# Patient Record
Sex: Male | Born: 1945 | Race: Black or African American | Hispanic: No | Marital: Married | Smoking: Former smoker
Health system: Southern US, Community
[De-identification: ages and names within clinical notes are randomized; demographics above are authoritative.]

## PROBLEM LIST (undated history)

## (undated) DIAGNOSIS — M810 Age-related osteoporosis without current pathological fracture: Secondary | ICD-10-CM

## (undated) DIAGNOSIS — R0902 Hypoxemia: Secondary | ICD-10-CM

## (undated) DIAGNOSIS — K219 Gastro-esophageal reflux disease without esophagitis: Secondary | ICD-10-CM

## (undated) DIAGNOSIS — J449 Chronic obstructive pulmonary disease, unspecified: Secondary | ICD-10-CM

## (undated) DIAGNOSIS — F32A Depression, unspecified: Secondary | ICD-10-CM

## (undated) DIAGNOSIS — F329 Major depressive disorder, single episode, unspecified: Secondary | ICD-10-CM

## (undated) HISTORY — DX: Age-related osteoporosis without current pathological fracture: M81.0

## (undated) HISTORY — DX: Chronic obstructive pulmonary disease, unspecified: J44.9

## (undated) HISTORY — DX: Hypoxemia: R09.02

## (undated) HISTORY — DX: Major depressive disorder, single episode, unspecified: F32.9

## (undated) HISTORY — DX: Gastro-esophageal reflux disease without esophagitis: K21.9

## (undated) HISTORY — DX: Depression, unspecified: F32.A

---

## 2011-08-20 ENCOUNTER — Encounter: Payer: Self-pay | Admitting: Physician Assistant

## 2011-09-07 ENCOUNTER — Encounter: Payer: Self-pay | Admitting: Physician Assistant

## 2011-10-05 ENCOUNTER — Encounter: Payer: Self-pay | Admitting: Physician Assistant

## 2011-11-05 ENCOUNTER — Encounter: Payer: Self-pay | Admitting: Physician Assistant

## 2011-12-05 ENCOUNTER — Encounter: Payer: Self-pay | Admitting: Physician Assistant

## 2014-09-14 DIAGNOSIS — M542 Cervicalgia: Secondary | ICD-10-CM | POA: Diagnosis not present

## 2014-09-14 DIAGNOSIS — J441 Chronic obstructive pulmonary disease with (acute) exacerbation: Secondary | ICD-10-CM | POA: Diagnosis not present

## 2014-09-21 DIAGNOSIS — J449 Chronic obstructive pulmonary disease, unspecified: Secondary | ICD-10-CM | POA: Diagnosis not present

## 2014-09-30 DIAGNOSIS — J449 Chronic obstructive pulmonary disease, unspecified: Secondary | ICD-10-CM | POA: Diagnosis not present

## 2014-10-11 DIAGNOSIS — M810 Age-related osteoporosis without current pathological fracture: Secondary | ICD-10-CM | POA: Diagnosis not present

## 2014-10-11 DIAGNOSIS — R0602 Shortness of breath: Secondary | ICD-10-CM | POA: Diagnosis not present

## 2014-10-11 DIAGNOSIS — Z7952 Long term (current) use of systemic steroids: Secondary | ICD-10-CM | POA: Diagnosis not present

## 2014-10-11 DIAGNOSIS — J441 Chronic obstructive pulmonary disease with (acute) exacerbation: Secondary | ICD-10-CM | POA: Diagnosis not present

## 2014-10-11 DIAGNOSIS — Z87891 Personal history of nicotine dependence: Secondary | ICD-10-CM | POA: Diagnosis not present

## 2014-10-11 DIAGNOSIS — K219 Gastro-esophageal reflux disease without esophagitis: Secondary | ICD-10-CM | POA: Diagnosis not present

## 2014-10-11 DIAGNOSIS — R0789 Other chest pain: Secondary | ICD-10-CM | POA: Diagnosis not present

## 2014-10-11 DIAGNOSIS — A419 Sepsis, unspecified organism: Secondary | ICD-10-CM | POA: Diagnosis not present

## 2014-10-11 DIAGNOSIS — Z79899 Other long term (current) drug therapy: Secondary | ICD-10-CM | POA: Diagnosis not present

## 2014-10-11 DIAGNOSIS — J9602 Acute respiratory failure with hypercapnia: Secondary | ICD-10-CM | POA: Diagnosis not present

## 2014-10-11 DIAGNOSIS — J449 Chronic obstructive pulmonary disease, unspecified: Secondary | ICD-10-CM | POA: Diagnosis not present

## 2014-10-18 DIAGNOSIS — J441 Chronic obstructive pulmonary disease with (acute) exacerbation: Secondary | ICD-10-CM | POA: Diagnosis not present

## 2014-10-18 DIAGNOSIS — Z9981 Dependence on supplemental oxygen: Secondary | ICD-10-CM | POA: Diagnosis not present

## 2014-10-19 DIAGNOSIS — J441 Chronic obstructive pulmonary disease with (acute) exacerbation: Secondary | ICD-10-CM | POA: Diagnosis not present

## 2014-10-19 DIAGNOSIS — Z9981 Dependence on supplemental oxygen: Secondary | ICD-10-CM | POA: Diagnosis not present

## 2014-10-20 DIAGNOSIS — J9692 Respiratory failure, unspecified with hypercapnia: Secondary | ICD-10-CM | POA: Diagnosis not present

## 2014-10-20 DIAGNOSIS — J441 Chronic obstructive pulmonary disease with (acute) exacerbation: Secondary | ICD-10-CM | POA: Diagnosis not present

## 2014-10-20 DIAGNOSIS — A419 Sepsis, unspecified organism: Secondary | ICD-10-CM | POA: Diagnosis not present

## 2014-10-21 DIAGNOSIS — J441 Chronic obstructive pulmonary disease with (acute) exacerbation: Secondary | ICD-10-CM | POA: Diagnosis not present

## 2014-10-21 DIAGNOSIS — Z9981 Dependence on supplemental oxygen: Secondary | ICD-10-CM | POA: Diagnosis not present

## 2014-10-25 DIAGNOSIS — J441 Chronic obstructive pulmonary disease with (acute) exacerbation: Secondary | ICD-10-CM | POA: Diagnosis not present

## 2014-10-25 DIAGNOSIS — Z9981 Dependence on supplemental oxygen: Secondary | ICD-10-CM | POA: Diagnosis not present

## 2014-10-26 DIAGNOSIS — J441 Chronic obstructive pulmonary disease with (acute) exacerbation: Secondary | ICD-10-CM | POA: Diagnosis not present

## 2014-10-26 DIAGNOSIS — Z9981 Dependence on supplemental oxygen: Secondary | ICD-10-CM | POA: Diagnosis not present

## 2014-10-28 DIAGNOSIS — Z9981 Dependence on supplemental oxygen: Secondary | ICD-10-CM | POA: Diagnosis not present

## 2014-10-28 DIAGNOSIS — J441 Chronic obstructive pulmonary disease with (acute) exacerbation: Secondary | ICD-10-CM | POA: Diagnosis not present

## 2014-10-29 DIAGNOSIS — J449 Chronic obstructive pulmonary disease, unspecified: Secondary | ICD-10-CM | POA: Diagnosis not present

## 2014-10-29 DIAGNOSIS — J441 Chronic obstructive pulmonary disease with (acute) exacerbation: Secondary | ICD-10-CM | POA: Diagnosis not present

## 2014-10-31 DIAGNOSIS — R069 Unspecified abnormalities of breathing: Secondary | ICD-10-CM | POA: Diagnosis not present

## 2014-10-31 DIAGNOSIS — J449 Chronic obstructive pulmonary disease, unspecified: Secondary | ICD-10-CM | POA: Diagnosis not present

## 2014-10-31 DIAGNOSIS — M1990 Unspecified osteoarthritis, unspecified site: Secondary | ICD-10-CM | POA: Diagnosis not present

## 2014-10-31 DIAGNOSIS — R0602 Shortness of breath: Secondary | ICD-10-CM | POA: Diagnosis not present

## 2014-10-31 DIAGNOSIS — R05 Cough: Secondary | ICD-10-CM | POA: Diagnosis not present

## 2014-10-31 DIAGNOSIS — Z87891 Personal history of nicotine dependence: Secondary | ICD-10-CM | POA: Diagnosis not present

## 2014-11-01 DIAGNOSIS — Z9981 Dependence on supplemental oxygen: Secondary | ICD-10-CM | POA: Diagnosis not present

## 2014-11-01 DIAGNOSIS — J441 Chronic obstructive pulmonary disease with (acute) exacerbation: Secondary | ICD-10-CM | POA: Diagnosis not present

## 2014-11-03 DIAGNOSIS — J441 Chronic obstructive pulmonary disease with (acute) exacerbation: Secondary | ICD-10-CM | POA: Diagnosis not present

## 2014-11-03 DIAGNOSIS — Z9981 Dependence on supplemental oxygen: Secondary | ICD-10-CM | POA: Diagnosis not present

## 2014-11-08 DIAGNOSIS — Z9981 Dependence on supplemental oxygen: Secondary | ICD-10-CM | POA: Diagnosis not present

## 2014-11-08 DIAGNOSIS — J441 Chronic obstructive pulmonary disease with (acute) exacerbation: Secondary | ICD-10-CM | POA: Diagnosis not present

## 2014-11-10 DIAGNOSIS — J441 Chronic obstructive pulmonary disease with (acute) exacerbation: Secondary | ICD-10-CM | POA: Diagnosis not present

## 2014-11-10 DIAGNOSIS — Z9981 Dependence on supplemental oxygen: Secondary | ICD-10-CM | POA: Diagnosis not present

## 2014-11-15 DIAGNOSIS — J441 Chronic obstructive pulmonary disease with (acute) exacerbation: Secondary | ICD-10-CM | POA: Diagnosis not present

## 2014-11-15 DIAGNOSIS — Z9981 Dependence on supplemental oxygen: Secondary | ICD-10-CM | POA: Diagnosis not present

## 2014-11-17 DIAGNOSIS — J441 Chronic obstructive pulmonary disease with (acute) exacerbation: Secondary | ICD-10-CM | POA: Diagnosis not present

## 2014-11-17 DIAGNOSIS — Z9981 Dependence on supplemental oxygen: Secondary | ICD-10-CM | POA: Diagnosis not present

## 2014-11-22 DIAGNOSIS — J441 Chronic obstructive pulmonary disease with (acute) exacerbation: Secondary | ICD-10-CM | POA: Diagnosis not present

## 2014-11-22 DIAGNOSIS — Z9981 Dependence on supplemental oxygen: Secondary | ICD-10-CM | POA: Diagnosis not present

## 2014-11-24 DIAGNOSIS — Z9981 Dependence on supplemental oxygen: Secondary | ICD-10-CM | POA: Diagnosis not present

## 2014-11-24 DIAGNOSIS — J441 Chronic obstructive pulmonary disease with (acute) exacerbation: Secondary | ICD-10-CM | POA: Diagnosis not present

## 2014-11-29 DIAGNOSIS — Z9981 Dependence on supplemental oxygen: Secondary | ICD-10-CM | POA: Diagnosis not present

## 2014-11-29 DIAGNOSIS — J441 Chronic obstructive pulmonary disease with (acute) exacerbation: Secondary | ICD-10-CM | POA: Diagnosis not present

## 2014-12-01 DIAGNOSIS — J441 Chronic obstructive pulmonary disease with (acute) exacerbation: Secondary | ICD-10-CM | POA: Diagnosis not present

## 2014-12-01 DIAGNOSIS — Z9981 Dependence on supplemental oxygen: Secondary | ICD-10-CM | POA: Diagnosis not present

## 2015-01-04 DIAGNOSIS — J9602 Acute respiratory failure with hypercapnia: Secondary | ICD-10-CM | POA: Diagnosis not present

## 2015-01-04 DIAGNOSIS — R0602 Shortness of breath: Secondary | ICD-10-CM | POA: Diagnosis not present

## 2015-01-04 DIAGNOSIS — J9622 Acute and chronic respiratory failure with hypercapnia: Secondary | ICD-10-CM | POA: Diagnosis not present

## 2015-01-04 DIAGNOSIS — R531 Weakness: Secondary | ICD-10-CM | POA: Diagnosis not present

## 2015-01-04 DIAGNOSIS — J449 Chronic obstructive pulmonary disease, unspecified: Secondary | ICD-10-CM | POA: Diagnosis not present

## 2015-01-04 DIAGNOSIS — D62 Acute posthemorrhagic anemia: Secondary | ICD-10-CM | POA: Diagnosis not present

## 2015-01-04 DIAGNOSIS — K2970 Gastritis, unspecified, without bleeding: Secondary | ICD-10-CM | POA: Diagnosis not present

## 2015-01-04 DIAGNOSIS — J9690 Respiratory failure, unspecified, unspecified whether with hypoxia or hypercapnia: Secondary | ICD-10-CM | POA: Diagnosis not present

## 2015-01-04 DIAGNOSIS — J189 Pneumonia, unspecified organism: Secondary | ICD-10-CM | POA: Diagnosis not present

## 2015-01-04 DIAGNOSIS — J9692 Respiratory failure, unspecified with hypercapnia: Secondary | ICD-10-CM | POA: Diagnosis not present

## 2015-01-04 DIAGNOSIS — K922 Gastrointestinal hemorrhage, unspecified: Secondary | ICD-10-CM | POA: Diagnosis not present

## 2015-01-04 DIAGNOSIS — Z4682 Encounter for fitting and adjustment of non-vascular catheter: Secondary | ICD-10-CM | POA: Diagnosis not present

## 2015-01-04 DIAGNOSIS — J9600 Acute respiratory failure, unspecified whether with hypoxia or hypercapnia: Secondary | ICD-10-CM | POA: Diagnosis not present

## 2015-01-04 DIAGNOSIS — J156 Pneumonia due to other aerobic Gram-negative bacteria: Secondary | ICD-10-CM | POA: Diagnosis not present

## 2015-01-04 DIAGNOSIS — R0600 Dyspnea, unspecified: Secondary | ICD-10-CM | POA: Diagnosis not present

## 2015-01-04 DIAGNOSIS — R404 Transient alteration of awareness: Secondary | ICD-10-CM | POA: Diagnosis not present

## 2015-01-04 DIAGNOSIS — I952 Hypotension due to drugs: Secondary | ICD-10-CM | POA: Diagnosis not present

## 2015-01-04 DIAGNOSIS — N400 Enlarged prostate without lower urinary tract symptoms: Secondary | ICD-10-CM | POA: Diagnosis not present

## 2015-01-04 DIAGNOSIS — T41295A Adverse effect of other general anesthetics, initial encounter: Secondary | ICD-10-CM | POA: Diagnosis not present

## 2015-01-04 DIAGNOSIS — A419 Sepsis, unspecified organism: Secondary | ICD-10-CM | POA: Diagnosis not present

## 2015-01-04 DIAGNOSIS — J9811 Atelectasis: Secondary | ICD-10-CM | POA: Diagnosis not present

## 2015-01-04 DIAGNOSIS — D649 Anemia, unspecified: Secondary | ICD-10-CM | POA: Diagnosis not present

## 2015-01-04 DIAGNOSIS — Z7401 Bed confinement status: Secondary | ICD-10-CM | POA: Diagnosis not present

## 2015-01-04 DIAGNOSIS — J181 Lobar pneumonia, unspecified organism: Secondary | ICD-10-CM | POA: Diagnosis not present

## 2015-01-04 DIAGNOSIS — Z9981 Dependence on supplemental oxygen: Secondary | ICD-10-CM | POA: Diagnosis not present

## 2015-01-04 DIAGNOSIS — J441 Chronic obstructive pulmonary disease with (acute) exacerbation: Secondary | ICD-10-CM | POA: Diagnosis not present

## 2015-01-04 DIAGNOSIS — J439 Emphysema, unspecified: Secondary | ICD-10-CM | POA: Diagnosis not present

## 2015-01-04 DIAGNOSIS — M1990 Unspecified osteoarthritis, unspecified site: Secondary | ICD-10-CM | POA: Diagnosis not present

## 2015-01-04 DIAGNOSIS — R195 Other fecal abnormalities: Secondary | ICD-10-CM | POA: Diagnosis not present

## 2015-01-04 DIAGNOSIS — K91840 Postprocedural hemorrhage and hematoma of a digestive system organ or structure following a digestive system procedure: Secondary | ICD-10-CM | POA: Diagnosis not present

## 2015-01-14 DIAGNOSIS — R627 Adult failure to thrive: Secondary | ICD-10-CM | POA: Diagnosis not present

## 2015-01-14 DIAGNOSIS — J9602 Acute respiratory failure with hypercapnia: Secondary | ICD-10-CM | POA: Diagnosis not present

## 2015-01-14 DIAGNOSIS — J9692 Respiratory failure, unspecified with hypercapnia: Secondary | ICD-10-CM | POA: Diagnosis not present

## 2015-01-14 DIAGNOSIS — J9610 Chronic respiratory failure, unspecified whether with hypoxia or hypercapnia: Secondary | ICD-10-CM | POA: Diagnosis not present

## 2015-01-14 DIAGNOSIS — J159 Unspecified bacterial pneumonia: Secondary | ICD-10-CM | POA: Diagnosis not present

## 2015-01-14 DIAGNOSIS — J156 Pneumonia due to other aerobic Gram-negative bacteria: Secondary | ICD-10-CM | POA: Diagnosis not present

## 2015-01-14 DIAGNOSIS — A419 Sepsis, unspecified organism: Secondary | ICD-10-CM | POA: Diagnosis not present

## 2015-01-14 DIAGNOSIS — J441 Chronic obstructive pulmonary disease with (acute) exacerbation: Secondary | ICD-10-CM | POA: Diagnosis not present

## 2015-01-14 DIAGNOSIS — Z9981 Dependence on supplemental oxygen: Secondary | ICD-10-CM | POA: Diagnosis not present

## 2015-01-14 DIAGNOSIS — Z7401 Bed confinement status: Secondary | ICD-10-CM | POA: Diagnosis not present

## 2015-01-14 DIAGNOSIS — J9600 Acute respiratory failure, unspecified whether with hypoxia or hypercapnia: Secondary | ICD-10-CM | POA: Diagnosis not present

## 2015-01-18 DIAGNOSIS — R627 Adult failure to thrive: Secondary | ICD-10-CM | POA: Diagnosis not present

## 2015-01-18 DIAGNOSIS — J159 Unspecified bacterial pneumonia: Secondary | ICD-10-CM | POA: Diagnosis not present

## 2015-01-18 DIAGNOSIS — J9610 Chronic respiratory failure, unspecified whether with hypoxia or hypercapnia: Secondary | ICD-10-CM | POA: Diagnosis not present

## 2015-01-18 DIAGNOSIS — J441 Chronic obstructive pulmonary disease with (acute) exacerbation: Secondary | ICD-10-CM | POA: Diagnosis not present

## 2015-01-30 DIAGNOSIS — J9602 Acute respiratory failure with hypercapnia: Secondary | ICD-10-CM | POA: Diagnosis not present

## 2015-01-30 DIAGNOSIS — Z9981 Dependence on supplemental oxygen: Secondary | ICD-10-CM | POA: Diagnosis not present

## 2015-01-30 DIAGNOSIS — J449 Chronic obstructive pulmonary disease, unspecified: Secondary | ICD-10-CM | POA: Diagnosis not present

## 2015-01-30 DIAGNOSIS — K219 Gastro-esophageal reflux disease without esophagitis: Secondary | ICD-10-CM | POA: Diagnosis not present

## 2015-01-30 DIAGNOSIS — D62 Acute posthemorrhagic anemia: Secondary | ICD-10-CM | POA: Diagnosis not present

## 2015-01-31 DIAGNOSIS — Z139 Encounter for screening, unspecified: Secondary | ICD-10-CM | POA: Diagnosis not present

## 2015-01-31 DIAGNOSIS — R5381 Other malaise: Secondary | ICD-10-CM | POA: Diagnosis not present

## 2015-01-31 DIAGNOSIS — Z681 Body mass index (BMI) 19 or less, adult: Secondary | ICD-10-CM | POA: Diagnosis not present

## 2015-01-31 DIAGNOSIS — Z9181 History of falling: Secondary | ICD-10-CM | POA: Diagnosis not present

## 2015-01-31 DIAGNOSIS — J449 Chronic obstructive pulmonary disease, unspecified: Secondary | ICD-10-CM | POA: Diagnosis not present

## 2015-01-31 DIAGNOSIS — J9692 Respiratory failure, unspecified with hypercapnia: Secondary | ICD-10-CM | POA: Diagnosis not present

## 2015-01-31 DIAGNOSIS — D500 Iron deficiency anemia secondary to blood loss (chronic): Secondary | ICD-10-CM | POA: Diagnosis not present

## 2015-02-01 ENCOUNTER — Encounter: Payer: Self-pay | Admitting: Internal Medicine

## 2015-02-01 ENCOUNTER — Encounter (INDEPENDENT_AMBULATORY_CARE_PROVIDER_SITE_OTHER): Payer: Self-pay

## 2015-02-01 ENCOUNTER — Ambulatory Visit (INDEPENDENT_AMBULATORY_CARE_PROVIDER_SITE_OTHER)
Admission: RE | Admit: 2015-02-01 | Discharge: 2015-02-01 | Disposition: A | Discharge status: Home / Self Care | Payer: Commercial Managed Care - HMO | Source: Ambulatory Visit | Attending: Internal Medicine | Admitting: Internal Medicine

## 2015-02-01 ENCOUNTER — Ambulatory Visit (INDEPENDENT_AMBULATORY_CARE_PROVIDER_SITE_OTHER): Payer: Commercial Managed Care - HMO | Admitting: Internal Medicine

## 2015-02-01 ENCOUNTER — Other Ambulatory Visit (INDEPENDENT_AMBULATORY_CARE_PROVIDER_SITE_OTHER): Payer: Commercial Managed Care - HMO

## 2015-02-01 VITALS — BP 110/78 | HR 114 | Ht 66.0 in | Wt 96.4 lb

## 2015-02-01 DIAGNOSIS — J9612 Chronic respiratory failure with hypercapnia: Secondary | ICD-10-CM | POA: Diagnosis not present

## 2015-02-01 DIAGNOSIS — R946 Abnormal results of thyroid function studies: Secondary | ICD-10-CM

## 2015-02-01 DIAGNOSIS — R0600 Dyspnea, unspecified: Secondary | ICD-10-CM

## 2015-02-01 DIAGNOSIS — J449 Chronic obstructive pulmonary disease, unspecified: Secondary | ICD-10-CM | POA: Diagnosis not present

## 2015-02-01 LAB — BASIC METABOLIC PANEL
BUN: 15 mg/dL (ref 6–23)
CO2: 45 mEq/L — ABNORMAL HIGH (ref 19–32)
Calcium: 10.1 mg/dL (ref 8.4–10.5)
Chloride: 93 mEq/L — ABNORMAL LOW (ref 96–112)
Creatinine, Ser: 0.72 mg/dL (ref 0.40–1.50)
GFR: 139.89 mL/min (ref >60.00)
Glucose, Bld: 166 mg/dL — ABNORMAL HIGH (ref 70–99)
Potassium: 4.4 mEq/L (ref 3.5–5.1)
SODIUM: 146 meq/L — AB (ref 135–145)

## 2015-02-01 LAB — CBC WITH DIFFERENTIAL/PLATELET
BASOS PCT: 0 % (ref 0.0–3.0)
Basophils Absolute: 0 10*3/uL (ref 0.0–0.1)
EOS ABS: 0 10*3/uL (ref 0.0–0.7)
EOS PCT: 0.1 % (ref 0.0–5.0)
HCT: 36.5 % — ABNORMAL LOW (ref 39.0–52.0)
HEMOGLOBIN: 11.6 g/dL — AB (ref 13.0–17.0)
Lymphocytes Relative: 2.7 % — ABNORMAL LOW (ref 12.0–46.0)
Lymphs Abs: 0.3 10*3/uL — ABNORMAL LOW (ref 0.7–4.0)
MCHC: 31.6 g/dL (ref 30.0–36.0)
MCV: 93.4 fl (ref 78.0–100.0)
MONO ABS: 0.7 10*3/uL (ref 0.1–1.0)
Monocytes Relative: 6.2 % (ref 3.0–12.0)
NEUTROS ABS: 10.6 10*3/uL — AB (ref 1.4–7.7)
Platelets: 337 10*3/uL (ref 150.0–400.0)
RBC: 3.91 Mil/uL — AB (ref 4.22–5.81)
RDW: 15.2 % (ref 11.5–15.5)
WBC: 11.7 10*3/uL — AB (ref 4.0–10.5)

## 2015-02-01 LAB — TSH: TSH: 0.2 u[IU]/mL — ABNORMAL LOW (ref 0.35–4.50)

## 2015-02-01 LAB — BRAIN NATRIURETIC PEPTIDE: Pro B Natriuretic peptide (BNP): 16 pg/mL (ref 0.0–100.0)

## 2015-02-01 MED ORDER — UMECLIDINIUM-VILANTEROL 62.5-25 MCG/INH IN AEPB: 2.0000 | INHALATION_SPRAY | Freq: Once | RESPIRATORY_TRACT | Status: DC

## 2015-02-01 NOTE — Progress Notes (Signed)
Subjective:    Patient ID: Edgar Robinson, male    DOB: 03/04/1946,    MRN: [id number]  HPI  71 yobm ?quit smoking 2008 (records say still smoking at [facility] 01/04/15) 02 since 2009 with dx of severe copd with variable doe best days = on 2lpm across the room just discharged from [facility] 01/04/15 to 01/14/15 with CAP/ hypercarbic rf requiring vent but not trach and 18 days snf p d/c from [place] referred 02/01/15 by Cyndi Bender to pulmonary  clinic    02/01/2015 1st [facility] office visit/ Avalyn Molino   Chief Complaint  Patient presents with  . Pulmonary Consult    Referred by Cyndi Bender, PA. Pt states dxed with COPD in 2009. He c/o increased SOB for the past several yrs. Has had PNA x 2 in 2016 and was intubated in May 2016. He c/o SOB with any exertion.    doe x 50 ft on 02 but also very weak assoc with dysphagia/ heartburn x months / maint on symbicort but can't use it adequately/ neb alb helps some   No obvious day to day or daytime variabilty or assoc chronic cough or cp or chest tightness, subjective wheeze  Or overt sinus  symptoms. No unusual exp hx or h/o childhood pna/ asthma or knowledge of premature birth.  Sleeping ok on 02  without nocturnal  or early am exacerbation  of respiratory  c/o's or need for noct saba. Also denies any obvious fluctuation of symptoms with weather or environmental changes or other aggravating or alleviating factors except as outlined above   Current Medications, Allergies, Complete Past Medical History, Past Surgical History, Family History, and Social History were reviewed in [employer] record.        Review of Systems  Constitutional: Positive for appetite change and unexpected weight change. Negative for fever, chills and activity change.  HENT: Positive for congestion, sore throat and trouble swallowing. Negative for dental problem, postnasal drip, rhinorrhea, sneezing and voice change.   Eyes: Negative for  visual disturbance.  Respiratory: Positive for cough and shortness of breath. Negative for choking.   Cardiovascular: Negative for chest pain and leg swelling.  Gastrointestinal: Negative for nausea, vomiting and abdominal pain.  Genitourinary: Negative for difficulty urinating.       Acid heartburn Indigestion  Musculoskeletal: Negative for arthralgias.  Skin: Negative for rash.  Psychiatric/Behavioral: Negative for behavioral problems and confusion.       Objective:   Physical Exam  Very thin bm hopeless affect  Wt Readings from Last 3 Encounters:  02/01/15 96 lb 6.4 oz (43.727 kg)    Vital signs reviewed   HEENT mild turbinate edema.  Oropharynx no thrush or excess pnd or cobblestoning.  No JVD or cervical adenopathy. Mild accessory muscle hypertrophy. Trachea midline, nl thryroid. Chest was hyperinflated by percussion with diminished breath sounds and moderate increased exp time without wheeze. Hoover sign positive at mid inspiration. Regular rate and rhythm without murmur gallop or rub or increase P2 or edema.  Abd: no hsm, nl excursion. Ext warm without cyanosis or clubbing. Musculature ix very wasted.    CXR PA and Lateral:   02/01/2015 :     I personally reviewed images and agree with radiology impression as follows:     COPD with bullous changes on the left and coarse increased interstitial markings on the right. There is no evidence of acute cardiopulmonary abnormality.    Labs ordered/ reviewed:   Lab 02/01/15 1207  NA 146*  K 4.4  CL 93*  CO2 45*  BUN 15  CREATININE 0.72  GLUCOSE 166*     Lab 02/01/15 1207  HGB 11.6*  HCT 36.5*  WBC 11.7*  PLT 337.0     Lab Results  Component Value Date   TSH 0.20* 02/01/2015     Lab Results  Component Value Date   PROBNP 16.0 02/01/2015     No results found for: ESRSEDRATE, SEDRATE, POCTSEDRATE         Assessment & Plan:

## 2015-02-01 NOTE — Patient Instructions (Addendum)
Stop symbicort/ fosfamax  Start Anoro 2 good drags first thing in am  Only use your albuterol as a rescue medication to be used if you can't catch your breath by resting or doing a relaxed purse lip breathing pattern.  - The less you use it, the better it will work when you need it. - Ok to use up to 4 hours if you must but call for immediate appointment if use goes up over your usual need  Continue protonix  Take 30-60 min before first meal of the day   GERD (REFLUX)  is an extremely common cause of respiratory symptoms just like yours , many times with no obvious heartburn at all.    It can be treated with medication, but also with lifestyle changes including elevation of the head of your bed (ideally with 6 inch  bed blocks),  Smoking cessation, avoidance of late meals, excessive alcohol, and avoid fatty foods, chocolate, peppermint, colas, red wine, and acidic juices such as orange juice.  NO MINT OR MENTHOL PRODUCTS SO NO COUGH DROPS  USE SUGARLESS CANDY INSTEAD (Jolley ranchers or Stover's or Life Savers) or even ice chips will also do - the key is to swallow to prevent all throat clearing. NO OIL BASED VITAMINS - use powdered substitutes.  Please remember to go to the lab and x-[facility] department downstairs for your tests - we will call you with the results when they are available.    Please schedule a follow up office visit in 2 weeks, sooner if needed with all active medications in hand including nebulizer meds  and over the counters

## 2015-02-02 ENCOUNTER — Encounter: Payer: Self-pay | Admitting: Internal Medicine

## 2015-02-02 DIAGNOSIS — J9612 Chronic respiratory failure with hypercapnia: Secondary | ICD-10-CM | POA: Insufficient documentation

## 2015-02-02 DIAGNOSIS — R946 Abnormal results of thyroid function studies: Secondary | ICD-10-CM | POA: Insufficient documentation

## 2015-02-02 NOTE — Assessment & Plan Note (Addendum)
DDX of  difficult airways management all start with A and  include Adherence, Ace Inhibitors, Acid Reflux, Active Sinus Disease, Alpha 1 Antitripsin deficiency, Anxiety masquerading as Airways dz,  ABPA,  allergy(esp in young), Aspiration (esp in elderly), Adverse effects of meds,  Active smokers, A bunch of PE's (a small clot burden can't cause this syndrome unless there is already severe underlying pulm or vascular dz with poor reserve) plus two Bs  = Bronchiectasis and Beta blocker use..and one C= CHF  Adherence is always the initial "prime suspect" and is a multilayered concern that requires a "trust but verify" approach in every patient - starting with knowing how to use medications, especially inhalers, correctly, keeping up with refills and understanding the fundamental difference between maintenance and prns vs those medications only taken for a very short course and then stopped and not refilled.  - The proper method of use, as well as anticipated side effects, of a metered-dose inhaler are discussed and demonstrated to the patient. Improved effectiveness after extensive coaching during this visit to a level of approximately  25% so rec d/c symbicort / 90% with dpi so rec trial of anoro  ? Active smoker > he denies since 2008 but records suggest still smoking one month prior to OV    ? Acid (or non-acid) GERD > always difficult to exclude as up to 75% of pts in some series report no assoc GI/ Heartburn symptoms> rec max (24h)  acid suppression and diet restrictions/ reviewed and instructions given in writing and suggested he stop fosfamax for now for same reason   I had an extended discussion with the patient reviewing all relevant studies completed to date and  lasting  4 m Each maintenance medication was reviewed in detail including most importantly the difference between maintenance and prns and under what circumstances the prns are to be triggered using an action plan format that is not  reflected in the computer generated alphabetically organized AVS.    Please see instructions for details which were reviewed in writing and the patient given a copy highlighting the part that I personally wrote and discussed at today's ov.

## 2015-02-02 NOTE — Assessment & Plan Note (Signed)
Clearly multifactorial but at present he appears more limited by muscle wasting than copd ? Underlying ca or thyroid dz

## 2015-02-02 NOTE — Assessment & Plan Note (Signed)
-    02/02/2015   Walked RA x one lap 50 f stopped due to  Sob weak/ not sob - HCO03 45 02/01/15   Adequate control on present rx, reviewed > no change in rx needed    NB Though somewhat paradoxic, when the lung fails to clear C02 properly and pC02 rises the lung then becomes a more efficient scavenger of C02 allowing lower work of breathing and  better C02 clearance albeit at a higher serum pC02 level - this is why pts can look a lot better than their ABG's would suggest and why it's so difficult to prognosticate endstage dz.  It's also why I strongly rec DNI status (ventilating pts down to a nl pC02 adversely affects this compensatory mechanism)

## 2015-02-02 NOTE — Assessment & Plan Note (Signed)
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

## 2015-02-02 NOTE — Progress Notes (Signed)
Quick Note:  Spoke with pt and notified of results per Dr. Wert. Pt verbalized understanding and denied any questions.  ______ 

## 2015-02-02 NOTE — Progress Notes (Signed)
Quick Note:  Called spoke with pt. Reviewed results and recs. Patient has a appt on 02/25/15 with TP. Patient voiced understanding and had no further questions. ______

## 2015-02-03 DIAGNOSIS — J9602 Acute respiratory failure with hypercapnia: Secondary | ICD-10-CM | POA: Diagnosis not present

## 2015-02-03 DIAGNOSIS — J449 Chronic obstructive pulmonary disease, unspecified: Secondary | ICD-10-CM | POA: Diagnosis not present

## 2015-02-03 DIAGNOSIS — Z9981 Dependence on supplemental oxygen: Secondary | ICD-10-CM | POA: Diagnosis not present

## 2015-02-03 DIAGNOSIS — K219 Gastro-esophageal reflux disease without esophagitis: Secondary | ICD-10-CM | POA: Diagnosis not present

## 2015-02-03 DIAGNOSIS — D62 Acute posthemorrhagic anemia: Secondary | ICD-10-CM | POA: Diagnosis not present

## 2015-02-04 DIAGNOSIS — D62 Acute posthemorrhagic anemia: Secondary | ICD-10-CM | POA: Diagnosis not present

## 2015-02-04 DIAGNOSIS — J449 Chronic obstructive pulmonary disease, unspecified: Secondary | ICD-10-CM | POA: Diagnosis not present

## 2015-02-04 DIAGNOSIS — Z9981 Dependence on supplemental oxygen: Secondary | ICD-10-CM | POA: Diagnosis not present

## 2015-02-04 DIAGNOSIS — J9602 Acute respiratory failure with hypercapnia: Secondary | ICD-10-CM | POA: Diagnosis not present

## 2015-02-04 DIAGNOSIS — K219 Gastro-esophageal reflux disease without esophagitis: Secondary | ICD-10-CM | POA: Diagnosis not present

## 2015-02-05 DIAGNOSIS — J449 Chronic obstructive pulmonary disease, unspecified: Secondary | ICD-10-CM | POA: Diagnosis not present

## 2015-02-05 DIAGNOSIS — K219 Gastro-esophageal reflux disease without esophagitis: Secondary | ICD-10-CM | POA: Diagnosis not present

## 2015-02-05 DIAGNOSIS — D62 Acute posthemorrhagic anemia: Secondary | ICD-10-CM | POA: Diagnosis not present

## 2015-02-05 DIAGNOSIS — J9602 Acute respiratory failure with hypercapnia: Secondary | ICD-10-CM | POA: Diagnosis not present

## 2015-02-05 DIAGNOSIS — Z9981 Dependence on supplemental oxygen: Secondary | ICD-10-CM | POA: Diagnosis not present

## 2015-02-09 DIAGNOSIS — J9602 Acute respiratory failure with hypercapnia: Secondary | ICD-10-CM | POA: Diagnosis not present

## 2015-02-09 DIAGNOSIS — J449 Chronic obstructive pulmonary disease, unspecified: Secondary | ICD-10-CM | POA: Diagnosis not present

## 2015-02-09 DIAGNOSIS — Z9981 Dependence on supplemental oxygen: Secondary | ICD-10-CM | POA: Diagnosis not present

## 2015-02-09 DIAGNOSIS — D62 Acute posthemorrhagic anemia: Secondary | ICD-10-CM | POA: Diagnosis not present

## 2015-02-09 DIAGNOSIS — K219 Gastro-esophageal reflux disease without esophagitis: Secondary | ICD-10-CM | POA: Diagnosis not present

## 2015-02-15 DIAGNOSIS — J449 Chronic obstructive pulmonary disease, unspecified: Secondary | ICD-10-CM | POA: Diagnosis not present

## 2015-02-15 DIAGNOSIS — Z9981 Dependence on supplemental oxygen: Secondary | ICD-10-CM | POA: Diagnosis not present

## 2015-02-15 DIAGNOSIS — J9602 Acute respiratory failure with hypercapnia: Secondary | ICD-10-CM | POA: Diagnosis not present

## 2015-02-15 DIAGNOSIS — K219 Gastro-esophageal reflux disease without esophagitis: Secondary | ICD-10-CM | POA: Diagnosis not present

## 2015-02-15 DIAGNOSIS — D62 Acute posthemorrhagic anemia: Secondary | ICD-10-CM | POA: Diagnosis not present

## 2015-02-25 ENCOUNTER — Encounter: Payer: Commercial Managed Care - HMO | Admitting: Adult Health

## 2015-02-25 DIAGNOSIS — Z9981 Dependence on supplemental oxygen: Secondary | ICD-10-CM | POA: Diagnosis not present

## 2015-02-25 DIAGNOSIS — D62 Acute posthemorrhagic anemia: Secondary | ICD-10-CM | POA: Diagnosis not present

## 2015-02-25 DIAGNOSIS — J449 Chronic obstructive pulmonary disease, unspecified: Secondary | ICD-10-CM | POA: Diagnosis not present

## 2015-02-25 DIAGNOSIS — J9602 Acute respiratory failure with hypercapnia: Secondary | ICD-10-CM | POA: Diagnosis not present

## 2015-02-25 DIAGNOSIS — K219 Gastro-esophageal reflux disease without esophagitis: Secondary | ICD-10-CM | POA: Diagnosis not present

## 2015-03-03 DIAGNOSIS — L89301 Pressure ulcer of unspecified buttock, stage 1: Secondary | ICD-10-CM | POA: Diagnosis not present

## 2015-03-03 DIAGNOSIS — K219 Gastro-esophageal reflux disease without esophagitis: Secondary | ICD-10-CM | POA: Diagnosis not present

## 2015-03-03 DIAGNOSIS — J449 Chronic obstructive pulmonary disease, unspecified: Secondary | ICD-10-CM | POA: Diagnosis not present

## 2015-03-03 DIAGNOSIS — Z681 Body mass index (BMI) 19 or less, adult: Secondary | ICD-10-CM | POA: Diagnosis not present

## 2015-03-03 DIAGNOSIS — M810 Age-related osteoporosis without current pathological fracture: Secondary | ICD-10-CM | POA: Diagnosis not present

## 2015-04-28 DIAGNOSIS — R6881 Early satiety: Secondary | ICD-10-CM | POA: Diagnosis not present

## 2015-04-28 DIAGNOSIS — Z23 Encounter for immunization: Secondary | ICD-10-CM | POA: Diagnosis not present

## 2015-04-28 DIAGNOSIS — J449 Chronic obstructive pulmonary disease, unspecified: Secondary | ICD-10-CM | POA: Diagnosis not present

## 2015-04-28 DIAGNOSIS — K219 Gastro-esophageal reflux disease without esophagitis: Secondary | ICD-10-CM | POA: Diagnosis not present

## 2015-04-28 DIAGNOSIS — R140 Abdominal distension (gaseous): Secondary | ICD-10-CM | POA: Diagnosis not present

## 2015-04-29 DIAGNOSIS — R140 Abdominal distension (gaseous): Secondary | ICD-10-CM | POA: Diagnosis not present

## 2015-06-08 DIAGNOSIS — Z1389 Encounter for screening for other disorder: Secondary | ICD-10-CM | POA: Diagnosis not present

## 2015-06-08 DIAGNOSIS — M810 Age-related osteoporosis without current pathological fracture: Secondary | ICD-10-CM | POA: Diagnosis not present

## 2015-06-08 DIAGNOSIS — Z23 Encounter for immunization: Secondary | ICD-10-CM | POA: Diagnosis not present

## 2015-06-08 DIAGNOSIS — K219 Gastro-esophageal reflux disease without esophagitis: Secondary | ICD-10-CM | POA: Diagnosis not present

## 2015-06-08 DIAGNOSIS — J449 Chronic obstructive pulmonary disease, unspecified: Secondary | ICD-10-CM | POA: Diagnosis not present

## 2015-07-04 DIAGNOSIS — R0602 Shortness of breath: Secondary | ICD-10-CM | POA: Diagnosis not present

## 2015-09-07 DIAGNOSIS — Z1389 Encounter for screening for other disorder: Secondary | ICD-10-CM | POA: Diagnosis not present

## 2015-09-07 DIAGNOSIS — Z79899 Other long term (current) drug therapy: Secondary | ICD-10-CM | POA: Diagnosis not present

## 2015-09-07 DIAGNOSIS — K219 Gastro-esophageal reflux disease without esophagitis: Secondary | ICD-10-CM | POA: Diagnosis not present

## 2015-09-07 DIAGNOSIS — Z681 Body mass index (BMI) 19 or less, adult: Secondary | ICD-10-CM | POA: Diagnosis not present

## 2015-09-07 DIAGNOSIS — E559 Vitamin D deficiency, unspecified: Secondary | ICD-10-CM | POA: Diagnosis not present

## 2015-09-07 DIAGNOSIS — M810 Age-related osteoporosis without current pathological fracture: Secondary | ICD-10-CM | POA: Diagnosis not present

## 2015-09-07 DIAGNOSIS — Z125 Encounter for screening for malignant neoplasm of prostate: Secondary | ICD-10-CM | POA: Diagnosis not present

## 2015-09-07 DIAGNOSIS — J449 Chronic obstructive pulmonary disease, unspecified: Secondary | ICD-10-CM | POA: Diagnosis not present

## 2015-09-27 DIAGNOSIS — J449 Chronic obstructive pulmonary disease, unspecified: Secondary | ICD-10-CM | POA: Diagnosis not present

## 2015-12-05 DIAGNOSIS — Z1389 Encounter for screening for other disorder: Secondary | ICD-10-CM | POA: Diagnosis not present

## 2015-12-05 DIAGNOSIS — Z681 Body mass index (BMI) 19 or less, adult: Secondary | ICD-10-CM | POA: Diagnosis not present

## 2015-12-05 DIAGNOSIS — K219 Gastro-esophageal reflux disease without esophagitis: Secondary | ICD-10-CM | POA: Diagnosis not present

## 2015-12-05 DIAGNOSIS — R972 Elevated prostate specific antigen [PSA]: Secondary | ICD-10-CM | POA: Diagnosis not present

## 2015-12-05 DIAGNOSIS — J449 Chronic obstructive pulmonary disease, unspecified: Secondary | ICD-10-CM | POA: Diagnosis not present

## 2015-12-05 DIAGNOSIS — M542 Cervicalgia: Secondary | ICD-10-CM | POA: Diagnosis not present

## 2015-12-05 DIAGNOSIS — M810 Age-related osteoporosis without current pathological fracture: Secondary | ICD-10-CM | POA: Diagnosis not present

## 2016-01-04 DIAGNOSIS — J441 Chronic obstructive pulmonary disease with (acute) exacerbation: Secondary | ICD-10-CM | POA: Diagnosis not present

## 2016-01-04 DIAGNOSIS — K529 Noninfective gastroenteritis and colitis, unspecified: Secondary | ICD-10-CM | POA: Diagnosis not present

## 2016-01-04 DIAGNOSIS — Z681 Body mass index (BMI) 19 or less, adult: Secondary | ICD-10-CM | POA: Diagnosis not present

## 2016-01-04 DIAGNOSIS — R220 Localized swelling, mass and lump, head: Secondary | ICD-10-CM | POA: Diagnosis not present

## 2016-01-20 DIAGNOSIS — R221 Localized swelling, mass and lump, neck: Secondary | ICD-10-CM | POA: Diagnosis not present

## 2016-01-20 DIAGNOSIS — J342 Deviated nasal septum: Secondary | ICD-10-CM | POA: Diagnosis not present

## 2016-01-20 DIAGNOSIS — Z87891 Personal history of nicotine dependence: Secondary | ICD-10-CM | POA: Diagnosis not present

## 2016-01-20 DIAGNOSIS — J449 Chronic obstructive pulmonary disease, unspecified: Secondary | ICD-10-CM | POA: Diagnosis not present

## 2016-01-31 DIAGNOSIS — D170 Benign lipomatous neoplasm of skin and subcutaneous tissue of head, face and neck: Secondary | ICD-10-CM | POA: Diagnosis not present

## 2016-01-31 DIAGNOSIS — Z79899 Other long term (current) drug therapy: Secondary | ICD-10-CM | POA: Diagnosis not present

## 2016-01-31 DIAGNOSIS — Z7982 Long term (current) use of aspirin: Secondary | ICD-10-CM | POA: Diagnosis not present

## 2016-01-31 DIAGNOSIS — J439 Emphysema, unspecified: Secondary | ICD-10-CM | POA: Diagnosis not present

## 2016-01-31 DIAGNOSIS — R221 Localized swelling, mass and lump, neck: Secondary | ICD-10-CM | POA: Diagnosis not present

## 2016-01-31 DIAGNOSIS — Z7952 Long term (current) use of systemic steroids: Secondary | ICD-10-CM | POA: Diagnosis not present

## 2016-02-06 DIAGNOSIS — R221 Localized swelling, mass and lump, neck: Secondary | ICD-10-CM | POA: Diagnosis not present

## 2016-02-06 DIAGNOSIS — D170 Benign lipomatous neoplasm of skin and subcutaneous tissue of head, face and neck: Secondary | ICD-10-CM | POA: Diagnosis not present

## 2016-02-21 DIAGNOSIS — D170 Benign lipomatous neoplasm of skin and subcutaneous tissue of head, face and neck: Secondary | ICD-10-CM | POA: Diagnosis not present

## 2016-02-21 DIAGNOSIS — R221 Localized swelling, mass and lump, neck: Secondary | ICD-10-CM | POA: Diagnosis not present

## 2016-03-06 DIAGNOSIS — M810 Age-related osteoporosis without current pathological fracture: Secondary | ICD-10-CM | POA: Diagnosis not present

## 2016-03-06 DIAGNOSIS — Z681 Body mass index (BMI) 19 or less, adult: Secondary | ICD-10-CM | POA: Diagnosis not present

## 2016-03-06 DIAGNOSIS — Z139 Encounter for screening, unspecified: Secondary | ICD-10-CM | POA: Diagnosis not present

## 2016-03-06 DIAGNOSIS — J449 Chronic obstructive pulmonary disease, unspecified: Secondary | ICD-10-CM | POA: Diagnosis not present

## 2016-03-06 DIAGNOSIS — Z9181 History of falling: Secondary | ICD-10-CM | POA: Diagnosis not present

## 2016-04-05 DIAGNOSIS — J441 Chronic obstructive pulmonary disease with (acute) exacerbation: Secondary | ICD-10-CM | POA: Diagnosis not present

## 2016-04-13 ENCOUNTER — Encounter: Payer: Self-pay | Admitting: *Deleted

## 2016-04-13 ENCOUNTER — Other Ambulatory Visit: Payer: Self-pay | Admitting: *Deleted

## 2016-04-13 NOTE — Patient Outreach (Signed)
[place] [facility]) Care Management  04/13/2016  Edgar Robinson Apr 21, 1946 [id number]  RN Health Coach attempted #1outreach call to patient.  Patient was unavailable. HIPPA compliance voicemail message was left with return callback number.  Plan:  RN will call patient again within 14 days.    [place] Care Management [phone]

## 2016-04-27 ENCOUNTER — Encounter: Payer: Self-pay | Admitting: *Deleted

## 2016-04-27 ENCOUNTER — Other Ambulatory Visit: Payer: Self-pay | Admitting: *Deleted

## 2016-04-27 NOTE — Patient Outreach (Signed)
[facility]) Care Management  04/27/2016   ORRIN SIMMONDS [birth year] [id number]  Subjective: RN Health Coach telephone call to patient.  Hipaa compliance verified. RN spoke with patient initially that gave verbal ok  to discuss with wife  patient care. Per wife patient is on continuous O2. Per wife patient had gotten choked on some oral medication and has a productive cough. Per wife the patient had been to the Dr and was placed on antibiotics but he still has an increase productive cough. RN explained to wife that she needs to make the Dr aware that productive coughing has increased and is still persisting. 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 Patient has received flu and pneumonia  Vaccine. Patient is taking medications as per order. Per wife patient is very deconditioned and is only able to go to one room at a time with increase shortness of breath. Wife stated that it would help them so much if they had a wheel chair to take patient out more in. Per wife the patient has a health care Power of Attorney  that she is going to attach to the consent and send in. Patient does not have a signed living will.  Patient and wife agreed to follow up outreach calls   Objective:   Current Medications:  Current Outpatient Prescriptions  Medication Sig Dispense Refill  . albuterol (PROVENTIL) (2.5 MG/3ML) 0.083% nebulizer solution Take 2.5 mg by nebulization every 6 (six) hours as needed for wheezing or shortness of breath.    Marland Kitchen albuterol (VENTOLIN HFA) 108 (90 BASE) MCG/ACT inhaler Inhale 2 puffs into the lungs every 6 (six) hours as needed for wheezing or shortness of breath.    Marland Kitchen aspirin 81 MG tablet Take 81 mg by mouth daily.    . Calcium Carbonate-Vitamin D (CALCIUM PLUS VITAMIN D PO) Take 1 tablet by mouth 2 (two) times daily.    Marland Kitchen guaiFENesin (MUCINEX) 600 MG 12 hr tablet Take 600 mg by mouth 2 (two) times daily.    . pantoprazole (PROTONIX) 40 MG tablet Take 40 mg by mouth daily.    .  predniSONE (DELTASONE) 20 MG tablet Take 40 mg by mouth daily with breakfast.    . Umeclidinium-Vilanterol (ANORO ELLIPTA) 62.5-25 MCG/INH AEPB Inhale 2 puffs into the lungs once. Only open the device one time and then take your two separate drags to be sure you get it all    . Lactobacillus Rhamnosus, GG, (CULTURELLE PO) Take 1 capsule by mouth 2 (two) times daily.    . montelukast (SINGULAIR) 10 MG tablet Take 10 mg by mouth at bedtime.    . senna (SENOKOT) 8.6 MG tablet Take 2 tablets by mouth daily.     No current facility-administered medications for this visit.     Functional Status:  In your present state of health, do you have any difficulty performing the following activities: 04/27/2016  Hearing? N  Vision? N  Difficulty concentrating or making decisions? N  Walking or climbing stairs? Y  Dressing or bathing? Y  Preparing Food and eating ? Y  Using the Toilet? N  In the past six months, have you accidently leaked urine? N  Do you have problems with loss of bowel control? N  Managing your Medications? N  Managing your Finances? N  Housekeeping or managing your Housekeeping? Y  Some recent data might be hidden    Fall Risk  04/27/2016  Falls in the past year? No  Risk for fall due  to : Impaired balance/gait;Impaired mobility   Assessment:  Patient is on continous O2 Patient will benefit from [place] telephonic outreach for education and support for COPD self management. Patient would like for a wheelchair order to be requested  Knowledge Deficit in Self Management of COPD Patient has received Flu and Pneumonia vaccine for the year  Plan: RN sent COPD packet with refrigerator magnet RN sent educational EMMI on when to call 911 and when to call DR RN sent information on over the counter medication give away RN sent copy of [employer] mail order catalog RN sent Emergency planning/management officer RN sent Ensure nutritional coupons per wife request RN sent Barrier letter and  assessment to physician RN called physician office for wheelchair request   [place] Management [phone]

## 2016-05-25 ENCOUNTER — Other Ambulatory Visit: Payer: Self-pay | Admitting: *Deleted

## 2016-05-25 NOTE — Patient Outreach (Signed)
[place] [facility]) Care Management  05/25/2016   Edgar Robinson 07/09/1946 [id number]  Subjective: RN Health Coach telephone call to patient.  Hipaa compliance verified. Patient gave verbal authorization for wife to speak with nurse. Per wife the patient is doing pretty good. He is in the green zone. Per wife the patient is sleeping late in the day. He has takes lots of naps during the day and sits around. Per wife he doesn't try to do the chair exercises any more.  Per wife patient doesn't try to go out unless he has to. RN discussed signs of depression. Per  Wife this has been going on for a long time. RN discussed referral to social worker to get patient into some therapy. Wife agreed for referral. Patient and wife agreed to follow up outreach calls.    Objective:   Current Medications:  Current Outpatient Prescriptions  Medication Sig Dispense Refill  . albuterol (PROVENTIL) (2.5 MG/3ML) 0.083% nebulizer solution Take 2.5 mg by nebulization every 6 (six) hours as needed for wheezing or shortness of breath.    Marland Kitchen albuterol (VENTOLIN HFA) 108 (90 BASE) MCG/ACT inhaler Inhale 2 puffs into the lungs every 6 (six) hours as needed for wheezing or shortness of breath.    Marland Kitchen aspirin 81 MG tablet Take 81 mg by mouth daily.    . Calcium Carbonate-Vitamin D (CALCIUM PLUS VITAMIN D PO) Take 1 tablet by mouth 2 (two) times daily.    Marland Kitchen guaiFENesin (MUCINEX) 600 MG 12 hr tablet Take 600 mg by mouth 2 (two) times daily.    . Lactobacillus Rhamnosus, GG, (CULTURELLE PO) Take 1 capsule by mouth 2 (two) times daily.    . montelukast (SINGULAIR) 10 MG tablet Take 10 mg by mouth at bedtime.    . pantoprazole (PROTONIX) 40 MG tablet Take 40 mg by mouth daily.    . predniSONE (DELTASONE) 20 MG tablet Take 40 mg by mouth daily with breakfast.    . senna (SENOKOT) 8.6 MG tablet Take 2 tablets by mouth daily.    Marland Kitchen Umeclidinium-Vilanterol (ANORO ELLIPTA) 62.5-25 MCG/INH AEPB Inhale 2 puffs into the lungs  once. Only open the device one time and then take your two separate drags to be sure you get it all     No current facility-administered medications for this visit.     Functional Status:  In your present state of health, do you have any difficulty performing the following activities: 05/25/2016 04/27/2016  Hearing? N N  Vision? N N  Difficulty concentrating or making decisions? N N  Walking or climbing stairs? Y Y  Dressing or bathing? Y Y  Doing errands, shopping? Y -  Conservation officer, nature and eating ? Y Y  Using the Toilet? N N  In the past six months, have you accidently leaked urine? N N  Do you have problems with loss of bowel control? N N  Managing your Medications? N N  Managing your Finances? N N  Housekeeping or managing your Housekeeping? Tempie Donning  Some recent data might be hidden    Fall/Depression Screening: PHQ 2/9 Scores 05/25/2016 04/27/2016  PHQ - 2 Score 6 1  PHQ- 9 Score 13 -   THN CM Care Plan Problem One   Flowsheet Row Most Recent Value  Care Plan Problem One  Knowledge Deficit in self management of COPD  Role Documenting the Problem One  Health [place] for Problem One  Active  THN Long Term Goal (31-90 days)  Patient will not have any admissions for COPD within the next 90 days  THN Long Term Goal Start Date  04/27/16  Interventions for Problem One [place] reminded patient and wife to keep appointments with PCP and [place] reminded patient and wife importance of taking medications as per physician order. RN Health Coach will be monitoring patient monthly  THN CM Short Term Goal #1 (0-30 days)  Patient and wife will be able to verbalize what the zones and action plan of copd are within the next 30 days  THN CM Short Term Goal #1 Start Date  04/27/16  Interventions for Short Term Goal #1  RN sent educational material on COPD. RN sent EMMI educational material on when to call Dr or 911. RN sent COPD zone magnet for  refrigerator  THN CM Short Term Goal #2 (0-30 days)  Patient will be able to verbalize special care for oxygen therapy within the next 30 days  THN CM Short Term Goal #2 Met Date  05/25/16  Interventions for Short Term Goal #2  RN sent educational material on oxygen safety. RN will follow up with discussion on additional emergency care for O2 tank problems  THN CM Short Term Goal #3 (0-30 days)  Patient will verbalize receivinf pneumonia and flu vaccines within the next 30 days.  THN CM Short Term Goal #3 Start Date  05/25/16  Interventions for Short Tern Goal #3  Rn sent educational material on flu, pneumonia and colds. RN will follow up with discussion and teachback      Assessment:  Patient will  Continue to benefit from [place] telephonic outreach for education and support for COPD self management.  Plan:  RN referred to Financial planner on community acquired pneumonia, flu and colds RN will follow up within the month of November   Auguste Tebbetts [place] Management [phone]

## 2016-05-28 ENCOUNTER — Encounter: Payer: Self-pay | Admitting: *Deleted

## 2016-05-28 ENCOUNTER — Other Ambulatory Visit: Payer: Self-pay | Admitting: *Deleted

## 2016-05-28 NOTE — Patient Outreach (Signed)
[facility]) Care Management  05/28/2016  Edgar Robinson 09-23-1945 [id number]   RN received returned telephone call from [place] APO. Patient needs to come in for evaluation for wheelchair. RN also made Dr office aware of referral to social worker for possible depression symptoms.  RN Health Coach called patient and wife to  make aware that he needs an appointment for wheelchair evaluation. RN instructed wife to call benefits department to see what suppliers they can use.  [place] Care Management [phone]

## 2016-05-30 ENCOUNTER — Other Ambulatory Visit: Payer: Self-pay | Admitting: *Deleted

## 2016-05-30 NOTE — Patient Outreach (Signed)
[place] [facility]) Care Management  05/30/2016  Edgar Robinson 1946-08-06 [id number]   CSW made an initial attempt to try and contact patient today to perform phone assessment, as well as assess and assist with social needs and services, without success.  A HIPAA complaint message was left for patient on voicemail.  Will await callback or try again later this week.  Eduard Clos, MSW, [place] Worker  [place] [phone]

## 2016-06-01 ENCOUNTER — Ambulatory Visit: Payer: Self-pay | Admitting: *Deleted

## 2016-06-05 DIAGNOSIS — J9610 Chronic respiratory failure, unspecified whether with hypoxia or hypercapnia: Secondary | ICD-10-CM | POA: Diagnosis not present

## 2016-06-05 DIAGNOSIS — Z23 Encounter for immunization: Secondary | ICD-10-CM | POA: Diagnosis not present

## 2016-06-05 DIAGNOSIS — R262 Difficulty in walking, not elsewhere classified: Secondary | ICD-10-CM | POA: Diagnosis not present

## 2016-06-05 DIAGNOSIS — Z681 Body mass index (BMI) 19 or less, adult: Secondary | ICD-10-CM | POA: Diagnosis not present

## 2016-06-05 DIAGNOSIS — J449 Chronic obstructive pulmonary disease, unspecified: Secondary | ICD-10-CM | POA: Diagnosis not present

## 2016-06-07 ENCOUNTER — Other Ambulatory Visit: Payer: Self-pay | Admitting: *Deleted

## 2016-06-07 NOTE — Patient Outreach (Signed)
[facility]) Care Management  [facility] Social Work  06/07/2016  Edgar Robinson 09-09-1945 [id number]  Subjective:   Referred to CSW for depression and outpatient therapy.   Encounter Medications:  Outpatient Encounter Prescriptions as of 06/07/2016  Medication Sig  . albuterol (PROVENTIL) (2.5 MG/3ML) 0.083% nebulizer solution Take 2.5 mg by nebulization every 6 (six) hours as needed for wheezing or shortness of breath.  Marland Kitchen albuterol (VENTOLIN HFA) 108 (90 BASE) MCG/ACT inhaler Inhale 2 puffs into the lungs every 6 (six) hours as needed for wheezing or shortness of breath.  Marland Kitchen aspirin 81 MG tablet Take 81 mg by mouth daily.  . Calcium Carbonate-Vitamin D (CALCIUM PLUS VITAMIN D PO) Take 1 tablet by mouth 2 (two) times daily.  Marland Kitchen guaiFENesin (MUCINEX) 600 MG 12 hr tablet Take 600 mg by mouth 2 (two) times daily.  . Lactobacillus Rhamnosus, GG, (CULTURELLE PO) Take 1 capsule by mouth 2 (two) times daily.  . montelukast (SINGULAIR) 10 MG tablet Take 10 mg by mouth at bedtime.  . pantoprazole (PROTONIX) 40 MG tablet Take 40 mg by mouth daily.  . predniSONE (DELTASONE) 20 MG tablet Take 40 mg by mouth daily with breakfast.  . senna (SENOKOT) 8.6 MG tablet Take 2 tablets by mouth daily.  Marland Kitchen Umeclidinium-Vilanterol (ANORO ELLIPTA) 62.5-25 MCG/INH AEPB Inhale 2 puffs into the lungs once. Only open the device one time and then take your two separate drags to be sure you get it all   No facility-administered encounter medications on file as of 06/07/2016.     Functional Status:  In your present state of health, do you have any difficulty performing the following activities: 05/25/2016 04/27/2016  Hearing? N N  Vision? N N  Difficulty concentrating or making decisions? N N  Walking or climbing stairs? Y Y  Dressing or bathing? Y Y  Doing errands, shopping? Y -  Conservation officer, nature and eating ? Y Y  Using the Toilet? N N  In the past six months, have you accidently leaked urine? N N  Do  you have problems with loss of bowel control? N N  Managing your Medications? N N  Managing your Finances? N N  Housekeeping or managing your Housekeeping? Edgar Robinson  Some recent data might be hidden    Fall/Depression Screening:  PHQ 2/9 Scores 05/25/2016 04/27/2016  PHQ - 2 Score 6 1  PHQ- 9 Score 13 -    Assessment: CSW spoke with wife by phone who reports he has been having some troubel "dealing with the past and what he has lost". He has not been to any outpatient counseling or therapy and she doubts he will agree to this- Wife asks that CSW send materials in the mail for him to  "read at home to help him deal with the past". CSW encouraged possible RX for depression and will make PCP aware of this concern and possible need.   CSW discussed resources through [facility] for LCSW phone f/u and will send information to him as well as make referral to [facility].   Wife advised to call CSW back if further needs arise- CSW will request CMA to mail information on Depression and [place] program and local outpatient providers to review/consider.       Plan: CSW encouraged wife to call me back if needs/concerns arise and to further discuss resources once rec'd.   [facility] CM Care Plan Problem One   Flowsheet Row Most Recent Value  Care Plan Problem One  Patient with depression  Role Documenting the Problem One  Clinical Social Worker  Care Plan for Problem One  Active  THN CM Short Term Goal #1 (0-30 days)  Patient and wife will receive information on resources to consider to help with his depression wihtin the next 30 days.  THN CM Short Term Goal #1 Start Date  06/07/16  Interventions for Short Term Goal #1  CSW discussed, educated and will mail resources to them for review/consideration to help with his depression.     Edgar Robinson, MSW, [place] Worker  [place] [phone]

## 2016-06-12 NOTE — Patient Outreach (Signed)
[place]) Care Management  06/12/2016  Edgar Robinson 09/28/1945 [id number]   Request received from Eduard Clos, LCSW to mail patient information on transportation benefits and [place] locations as well as mental health resources. Information mailed today, 06/12/16  Josepha Pigg, [place] Management Assistant

## 2016-06-18 ENCOUNTER — Other Ambulatory Visit: Payer: Self-pay | Admitting: *Deleted

## 2016-06-18 NOTE — Patient Outreach (Signed)
[place] [facility]) Care Management  06/18/2016  Edgar Robinson 1946-03-04 [id number]  RN Health Coach received  telephone call from patient wife.  Hipaa compliance verified. Pewr wife the patient is having loss of appetite, increasing coughing non productive and congestion. Per wife his chest sounds noisy. Patient wife stated she had been to the Dr office in October regarding the wheelchair and she didn't want to have to call them again. Per wife the last time he got sick they called in some antibiotics and he got better.  RN explained that if he is referred to community nurse she could not prescribe medications. RN instructed the nurse to call the Dr office and make them aware because he might be able to see him today or in the morning. Patient has history of pneumonia. RN also made wife that if he can go to an urgent care or ER if the Dr can't see him.  [place] [facility] Management [phone]

## 2016-06-20 DIAGNOSIS — J9610 Chronic respiratory failure, unspecified whether with hypoxia or hypercapnia: Secondary | ICD-10-CM | POA: Diagnosis not present

## 2016-06-20 DIAGNOSIS — J449 Chronic obstructive pulmonary disease, unspecified: Secondary | ICD-10-CM | POA: Diagnosis not present

## 2016-06-20 DIAGNOSIS — R262 Difficulty in walking, not elsewhere classified: Secondary | ICD-10-CM | POA: Diagnosis not present

## 2016-06-21 ENCOUNTER — Encounter: Payer: Self-pay | Admitting: *Deleted

## 2016-06-21 ENCOUNTER — Other Ambulatory Visit: Payer: Self-pay | Admitting: *Deleted

## 2016-06-21 NOTE — Patient Outreach (Signed)
[place] [facility]) Care Management  06/21/2016  Edgar Robinson 09/22/1945 [id number]   Wife reports "he is sleeping". Discussed concerns for depression with her which she indicates are better.  "I got him a wheelchair to use but I don't think he likes it".  CSW discussed signs/symptoms and the increase risk for depression during the holiday season. She feels he may talk to the [facility] but states he does not want to go see anybody- CSW will close case at this time per patient/wife request and have encouraged he participate in the [facility] LCSW calls as well to reach out to me if needs arise.   CSW will advise PCP and [facility] team of CSW case closure.   Eduard Clos, MSW, [place] Worker  [place] [phone]

## 2016-06-25 ENCOUNTER — Other Ambulatory Visit: Payer: Self-pay | Admitting: *Deleted

## 2016-06-25 ENCOUNTER — Encounter: Payer: Self-pay | Admitting: *Deleted

## 2016-06-26 ENCOUNTER — Encounter: Payer: Self-pay | Admitting: *Deleted

## 2016-06-26 NOTE — Patient Outreach (Signed)
[place]) Care Management 06/25/2016  Edgar Robinson Nov 12, 1945 [id number]  Subjective: RN Health Coach telephone call to patient.  Hipaa compliance verified. Per patient he is doing much better today. Patient stated he went to the Dr and was put on some antibiotics. Patient is unable to remember the zones but states he is breathing better. Shortness of breath on exertion but no more than usual. Patient discussed how the COPD occurred for him. . Per patient stated his appetite is a little better but he needs to gain some weight. Discussed with patient about ensure and mixing with ice cream for extra calories. Patient stated he is taking his medications as the Dr prescribed. Patient is using oxygen.  Patient has not been able to exercise due to fatigue and shortnes of breath and overall weakness.  Patient stated he has gotten his wheelchair but has not went out anywhere in it yet. Patient has agreed to follow up outreach calls.   Objective:   Current Medications:  Current Outpatient Prescriptions  Medication Sig Dispense Refill  . albuterol (PROVENTIL) (2.5 MG/3ML) 0.083% nebulizer solution Take 2.5 mg by nebulization every 6 (six) hours as needed for wheezing or shortness of breath.    Marland Kitchen albuterol (VENTOLIN HFA) 108 (90 BASE) MCG/ACT inhaler Inhale 2 puffs into the lungs every 6 (six) hours as needed for wheezing or shortness of breath.    Marland Kitchen aspirin 81 MG tablet Take 81 mg by mouth daily.    . Calcium Carbonate-Vitamin D (CALCIUM PLUS VITAMIN D PO) Take 1 tablet by mouth 2 (two) times daily.    Marland Kitchen guaiFENesin (MUCINEX) 600 MG 12 hr tablet Take 600 mg by mouth 2 (two) times daily.    . Lactobacillus Rhamnosus, GG, (CULTURELLE PO) Take 1 capsule by mouth 2 (two) times daily.    . montelukast (SINGULAIR) 10 MG tablet Take 10 mg by mouth at bedtime.    . pantoprazole (PROTONIX) 40 MG tablet Take 40 mg by mouth daily.    . predniSONE (DELTASONE) 20 MG tablet Take 40 mg by mouth  daily with breakfast.    . senna (SENOKOT) 8.6 MG tablet Take 2 tablets by mouth daily.    Marland Kitchen Umeclidinium-Vilanterol (ANORO ELLIPTA) 62.5-25 MCG/INH AEPB Inhale 2 puffs into the lungs once. Only open the device one time and then take your two separate drags to be sure you get it all     No current facility-administered medications for this visit.     Functional Status:  In your present state of health, do you have any difficulty performing the following activities: 06/25/2016 06/25/2016  Hearing? N N  Vision? N N  Difficulty concentrating or making decisions? N N  Walking or climbing stairs? Y Y  Dressing or bathing? Y Y  Doing errands, shopping? Y Y  Preparing Food and eating ? - -  Using the Toilet? - -  In the past six months, have you accidently leaked urine? - -  Do you have problems with loss of bowel control? - -  Managing your Medications? - -  Managing your Finances? - -  Housekeeping or managing your Housekeeping? - -  Some recent data might be hidden    Fall/Depression Screening: PHQ 2/9 Scores 06/25/2016 05/25/2016 04/27/2016  PHQ - 2 Score 6 6 1   PHQ- 9 Score 14 13 -   THN CM Care Plan Problem One   Flowsheet Row Most Recent Value  Care Plan Problem One  Knowledge Deficit in self management  of COPD  Role Documenting the Problem One  [place] for Problem One  Active  THN Long Term Goal (31-90 days)  Patient will not have any admissions for COPD within the next 90 days  THN Long Term Goal Start Date  04/27/16  Interventions for Problem One [place] reminded patient and wife to keep appointments with PCP and [place] reminded patient and wife importance of taking medications as per physician order. RN Health Coach will be monitoring patient monthly  THN CM Short Term Goal #1 (0-30 days)  Patient and wife will receive information on resources to consider to help with his depression wihtin the next 30 days.  THN  CM Short Term Goal #3 (0-30 days)  Patient will verbalize receiving pneumonia and flu vaccines within the next 30 days.  THN CM Short Term Goal #3 Start Date  06/26/16  Interventions for Short Tern Goal #3  Rn sent educational material on flu, pneumonia and colds. RN will follow up with discussion and teachback      Assessment:   Patient taking medications are per physician prescribed Patient is in [place] zone on antibiotics prescribed by physician Patient will continue to benefit from Health Coach telephonic outreach for education and support for COPD self management.  Plan:  RN sent educational EMMI on Oxygen safety RN sent educational material on using inhalers RN sent additional nutritional supplement coupons on Ensure RN sent educational material on proper use of nebulizer RN will follow up within the month of December  [place] Care Management [phone]

## 2016-07-03 DIAGNOSIS — R636 Underweight: Secondary | ICD-10-CM | POA: Diagnosis not present

## 2016-07-03 DIAGNOSIS — R972 Elevated prostate specific antigen [PSA]: Secondary | ICD-10-CM | POA: Diagnosis not present

## 2016-07-03 DIAGNOSIS — E559 Vitamin D deficiency, unspecified: Secondary | ICD-10-CM | POA: Diagnosis not present

## 2016-07-03 DIAGNOSIS — M810 Age-related osteoporosis without current pathological fracture: Secondary | ICD-10-CM | POA: Diagnosis not present

## 2016-07-03 DIAGNOSIS — J449 Chronic obstructive pulmonary disease, unspecified: Secondary | ICD-10-CM | POA: Diagnosis not present

## 2016-07-03 DIAGNOSIS — K219 Gastro-esophageal reflux disease without esophagitis: Secondary | ICD-10-CM | POA: Diagnosis not present

## 2016-07-03 DIAGNOSIS — Z79899 Other long term (current) drug therapy: Secondary | ICD-10-CM | POA: Diagnosis not present

## 2016-07-03 DIAGNOSIS — J9610 Chronic respiratory failure, unspecified whether with hypoxia or hypercapnia: Secondary | ICD-10-CM | POA: Diagnosis not present

## 2016-07-20 DIAGNOSIS — M85851 Other specified disorders of bone density and structure, right thigh: Secondary | ICD-10-CM | POA: Diagnosis not present

## 2016-07-20 DIAGNOSIS — J9610 Chronic respiratory failure, unspecified whether with hypoxia or hypercapnia: Secondary | ICD-10-CM | POA: Diagnosis not present

## 2016-07-20 DIAGNOSIS — J449 Chronic obstructive pulmonary disease, unspecified: Secondary | ICD-10-CM | POA: Diagnosis not present

## 2016-07-20 DIAGNOSIS — R262 Difficulty in walking, not elsewhere classified: Secondary | ICD-10-CM | POA: Diagnosis not present

## 2016-07-20 DIAGNOSIS — M810 Age-related osteoporosis without current pathological fracture: Secondary | ICD-10-CM | POA: Diagnosis not present

## 2016-07-25 ENCOUNTER — Other Ambulatory Visit: Payer: Self-pay | Admitting: *Deleted

## 2016-07-26 DIAGNOSIS — J449 Chronic obstructive pulmonary disease, unspecified: Secondary | ICD-10-CM | POA: Diagnosis not present

## 2016-08-20 DIAGNOSIS — J9610 Chronic respiratory failure, unspecified whether with hypoxia or hypercapnia: Secondary | ICD-10-CM | POA: Diagnosis not present

## 2016-08-20 DIAGNOSIS — J449 Chronic obstructive pulmonary disease, unspecified: Secondary | ICD-10-CM | POA: Diagnosis not present

## 2016-08-20 DIAGNOSIS — R262 Difficulty in walking, not elsewhere classified: Secondary | ICD-10-CM | POA: Diagnosis not present

## 2016-08-24 ENCOUNTER — Ambulatory Visit: Payer: Commercial Managed Care - HMO | Admitting: *Deleted

## 2016-08-24 NOTE — Patient Outreach (Signed)
[place] [facility]) Care Management  08/24/2016   Edgar Robinson April 23, 1946 [id number]  RN Health Coach telephone call to patient.  Hipaa compliance verified. Patient and wife talking to [employer]. Patient is breathing better. Per patient he still has a chronic cough. No discoloration in sputum. Per patient he is on a maintenance dose of prednisone. Per wife this is helping his lungs to not to fill up with fluid. Per wife the patient went out in his wheelchair once since receiving it. Per wife patient enjoyed it. Per wife the patient is looking forward to warmer weather to get out more. Patient is using oxygen continuously. Patient appetite has improved.   Patient has agreed to further outreach calls.    Current Medications:  Current Outpatient Prescriptions  Medication Sig Dispense Refill  . albuterol (PROVENTIL) (2.5 MG/3ML) 0.083% nebulizer solution Take 2.5 mg by nebulization every 6 (six) hours as needed for wheezing or shortness of breath.    Marland Kitchen albuterol (VENTOLIN HFA) 108 (90 BASE) MCG/ACT inhaler Inhale 2 puffs into the lungs every 6 (six) hours as needed for wheezing or shortness of breath.    Marland Kitchen aspirin 81 MG tablet Take 81 mg by mouth daily.    . Calcium Carbonate-Vitamin D (CALCIUM PLUS VITAMIN D PO) Take 1 tablet by mouth 2 (two) times daily.    Marland Kitchen guaiFENesin (MUCINEX) 600 MG 12 hr tablet Take 600 mg by mouth 2 (two) times daily.    . Lactobacillus Rhamnosus, GG, (CULTURELLE PO) Take 1 capsule by mouth 2 (two) times daily.    . montelukast (SINGULAIR) 10 MG tablet Take 10 mg by mouth at bedtime.    . pantoprazole (PROTONIX) 40 MG tablet Take 40 mg by mouth daily.    . predniSONE (DELTASONE) 20 MG tablet Take 40 mg by mouth daily with breakfast.    . senna (SENOKOT) 8.6 MG tablet Take 2 tablets by mouth daily.    Marland Kitchen Umeclidinium-Vilanterol (ANORO ELLIPTA) 62.5-25 MCG/INH AEPB Inhale 2 puffs into the lungs once. Only open the device one time and then take your two  separate drags to be sure you get it all     No current facility-administered medications for this visit.     Functional Status:  In your present state of health, do you have any difficulty performing the following activities: 08/24/2016 06/25/2016  Hearing? N N  Vision? N N  Difficulty concentrating or making decisions? N N  Walking or climbing stairs? Y Y  Dressing or bathing? Y Y  Doing errands, shopping? Tempie Donning  Preparing Food and eating ? Y -  Using the Toilet? N -  In the past six months, have you accidently leaked urine? N -  Do you have problems with loss of bowel control? N -  Managing your Medications? N -  Managing your Finances? N -  Housekeeping or managing your Housekeeping? Y -  Some recent data might be hidden    Fall/Depression Screening: PHQ 2/9 Scores 08/24/2016 06/25/2016 05/25/2016 04/27/2016  PHQ - 2 Score 6 6 6 1   PHQ- 9 Score 14 14 13  -   THN CM Care Plan Problem One   Flowsheet Row Most Recent Value  Care Plan Problem One  Knowledge Deficit in self management of COPD  Role Documenting the Problem One  [place] for Problem One  Active  THN Long Term Goal (31-90 days)  Patient will not have any admissions for COPD within the next 90 days  THN Long Term Goal Start Date  08/24/16  Interventions for Problem One [place] reminded patient and wife to keep appointments with PCP and [place] reminded patient and wife importance of taking medications as per physician order. RN Health Coach will be monitoring patient monthly  THN CM Short Term Goal #1 (0-30 days)  Patient and wife will receive information on resources to consider to help with his depression wihtin the next 30 days.  THN CM Short Term Goal #1 Start Date  08/24/16  THN CM Short Term Goal #1 Met Date  08/24/16  THN CM Short Term Goal #2 (0-30 days)  Patient will be able to verbalize that he is mainitaing weight  THN CM Short Term Goal #2 Start  Date  08/24/16  Interventions for Short Term Goal #2  RN discussed with patient about increasing food intake and  maintaining diet. RN will follow up discussion  THN CM Short Term Goal #3 (0-30 days)  Patient will verbalize receiving pneumonia and flu vaccines within the next 30 days.  THN CM Short Term Goal #3 Met Date  08/24/16  Interventions for Short Tern Goal #3  Rn sent educational material on flu, pneumonia and colds. RN will follow up with discussion and teachback     Plan:  RN Health Coach discussed COPD exacerbation symptoms RN Health Coach discussed nutritional needs RN Health coach will follow up outreach within the month of February  Aliesha Dolata [place] Management [phone]

## 2016-09-20 DIAGNOSIS — R262 Difficulty in walking, not elsewhere classified: Secondary | ICD-10-CM | POA: Diagnosis not present

## 2016-09-20 DIAGNOSIS — J9610 Chronic respiratory failure, unspecified whether with hypoxia or hypercapnia: Secondary | ICD-10-CM | POA: Diagnosis not present

## 2016-09-20 DIAGNOSIS — J449 Chronic obstructive pulmonary disease, unspecified: Secondary | ICD-10-CM | POA: Diagnosis not present

## 2016-09-26 ENCOUNTER — Ambulatory Visit: Payer: Self-pay | Admitting: *Deleted

## 2016-10-01 ENCOUNTER — Other Ambulatory Visit: Payer: Self-pay | Admitting: *Deleted

## 2016-10-01 NOTE — Patient Outreach (Signed)
[place] [facility]) Care Management  10/01/2016   Edgar Robinson 1945-08-12 [id number]  RN Health Coach telephone call to patient.  Hipaa compliance verified. Patient was taking a nap. Per wife the caregiver he is doing pretty good. Patient has not went out much in his wheelchair. Patient has started receiving ensure from [facility]. Per wife he doesn't like it very much. RN discussed with wife about making fruity milk shakes. Wife became very excited about this and stated she was going to try this. . Patient is trying to maintain his weight or gain a little if possible. Patient is taking medications as prescribed. Wife has agreed to follow up outreach calls.   Current Medications:  Current Outpatient Prescriptions  Medication Sig Dispense Refill  . albuterol (PROVENTIL) (2.5 MG/3ML) 0.083% nebulizer solution Take 2.5 mg by nebulization every 6 (six) hours as needed for wheezing or shortness of breath.    Marland Kitchen albuterol (VENTOLIN HFA) 108 (90 BASE) MCG/ACT inhaler Inhale 2 puffs into the lungs every 6 (six) hours as needed for wheezing or shortness of breath.    Marland Kitchen aspirin 81 MG tablet Take 81 mg by mouth daily.    . Calcium Carbonate-Vitamin D (CALCIUM PLUS VITAMIN D PO) Take 1 tablet by mouth 2 (two) times daily.    Marland Kitchen guaiFENesin (MUCINEX) 600 MG 12 hr tablet Take 600 mg by mouth 2 (two) times daily.    . Lactobacillus Rhamnosus, GG, (CULTURELLE PO) Take 1 capsule by mouth 2 (two) times daily.    . montelukast (SINGULAIR) 10 MG tablet Take 10 mg by mouth at bedtime.    . pantoprazole (PROTONIX) 40 MG tablet Take 40 mg by mouth daily.    . predniSONE (DELTASONE) 20 MG tablet Take 40 mg by mouth daily with breakfast.    . senna (SENOKOT) 8.6 MG tablet Take 2 tablets by mouth daily.    Marland Kitchen Umeclidinium-Vilanterol (ANORO ELLIPTA) 62.5-25 MCG/INH AEPB Inhale 2 puffs into the lungs once. Only open the device one time and then take your two separate drags to be sure you get it all     No current  facility-administered medications for this visit.     Functional Status:  In your present state of health, do you have any difficulty performing the following activities: 10/01/2016 10/01/2016  Hearing? N N  Vision? N N  Difficulty concentrating or making decisions? N N  Walking or climbing stairs? Y Y  Dressing or bathing? Y Y  Doing errands, shopping? Tempie Donning  Preparing Food and eating ? Y Y  Using the Toilet? N N  In the past six months, have you accidently leaked urine? N N  Do you have problems with loss of bowel control? N N  Managing your Medications? N N  Managing your Finances? N N  Housekeeping or managing your Housekeeping? N Y  Some recent data might be hidden    Fall/Depression Screening: PHQ 2/9 Scores 10/01/2016 08/24/2016 06/25/2016 05/25/2016 04/27/2016  PHQ - 2 Score 3 6 6 6 1   PHQ- 9 Score 10 14 14 13  -   THN CM Care Plan Problem One   Flowsheet Row Most Recent Value  Care Plan Problem One  Knowledge Deficit in self management of COPD  Role Documenting the Problem One  [place] for Problem One  Active  THN Long Term Goal (31-90 days)  Patient will not have any admissions for COPD within the next 90 days  THN Long Term Goal Start Date  10/01/16  Interventions for Problem One [place] reminded patient and wife to keep appointments with PCP and [place] reminded patient and wife importance of taking medications as per physician order. RN Health Coach will be monitoring patient monthly  THN CM Short Term Goal #4 (0-30 days)  Patient will appetite increasing within the next 30 days  THN CM Short Term Goal #4 Start Date  10/01/16      Assessment:   Patient will continue to  benefit from [place] telephonic outreach for education and support for COPD self management. Plan:  RN discussed with wife about making milkshakes out of ensure to promote better taste. RN discussed getting patient out of house when  sun out helps depression RN will follow patient in the month of March  Carleta Woodrow [place] Care Management [phone]

## 2016-10-02 DIAGNOSIS — J449 Chronic obstructive pulmonary disease, unspecified: Secondary | ICD-10-CM | POA: Diagnosis not present

## 2016-10-03 DIAGNOSIS — K219 Gastro-esophageal reflux disease without esophagitis: Secondary | ICD-10-CM | POA: Diagnosis not present

## 2016-10-03 DIAGNOSIS — J449 Chronic obstructive pulmonary disease, unspecified: Secondary | ICD-10-CM | POA: Diagnosis not present

## 2016-10-03 DIAGNOSIS — H539 Unspecified visual disturbance: Secondary | ICD-10-CM | POA: Diagnosis not present

## 2016-10-03 DIAGNOSIS — Z681 Body mass index (BMI) 19 or less, adult: Secondary | ICD-10-CM | POA: Diagnosis not present

## 2016-10-03 DIAGNOSIS — J9610 Chronic respiratory failure, unspecified whether with hypoxia or hypercapnia: Secondary | ICD-10-CM | POA: Diagnosis not present

## 2016-10-18 DIAGNOSIS — J449 Chronic obstructive pulmonary disease, unspecified: Secondary | ICD-10-CM | POA: Diagnosis not present

## 2016-10-18 DIAGNOSIS — R262 Difficulty in walking, not elsewhere classified: Secondary | ICD-10-CM | POA: Diagnosis not present

## 2016-10-18 DIAGNOSIS — J9610 Chronic respiratory failure, unspecified whether with hypoxia or hypercapnia: Secondary | ICD-10-CM | POA: Diagnosis not present

## 2016-10-23 DIAGNOSIS — H2513 Age-related nuclear cataract, bilateral: Secondary | ICD-10-CM | POA: Diagnosis not present

## 2016-10-29 ENCOUNTER — Ambulatory Visit: Payer: Self-pay | Admitting: *Deleted

## 2016-10-30 ENCOUNTER — Other Ambulatory Visit: Payer: Self-pay | Admitting: *Deleted

## 2016-10-31 NOTE — Patient Outreach (Signed)
[facility] - [place]) Care Management  10/31/2016   Edgar Robinson 1946-02-20 [id number]  RN Health Coach telephone call to patient.  Hipaa compliance verified. RN spoke with wife and patient. Per wife the patient had received a call that his nebulizer supplies had expired. Wife called Dr office but had not received supplies yet. RN call [facility] and Dr office and spoke with nurse, supplies are being renewed and sent. Patient is Stage 4 COPD. Per wife he is maintaining. No flu symptoms and no ed visits. Patient is short of breath on exertion. Patient is drinking ensure to support nutritional status. Patient has not been outside. Per wife maybe in a month he will be able to venture out.    Objective:   Current Medications:  Current Outpatient Prescriptions  Medication Sig Dispense Refill  . albuterol (PROVENTIL) (2.5 MG/3ML) 0.083% nebulizer solution Take 2.5 mg by nebulization every 6 (six) hours as needed for wheezing or shortness of breath.    Marland Kitchen albuterol (VENTOLIN HFA) 108 (90 BASE) MCG/ACT inhaler Inhale 2 puffs into the lungs every 6 (six) hours as needed for wheezing or shortness of breath.    Marland Kitchen aspirin 81 MG tablet Take 81 mg by mouth daily.    . Calcium Carbonate-Vitamin D (CALCIUM PLUS VITAMIN D PO) Take 1 tablet by mouth 2 (two) times daily.    Marland Kitchen guaiFENesin (MUCINEX) 600 MG 12 hr tablet Take 600 mg by mouth 2 (two) times daily.    . Lactobacillus Rhamnosus, GG, (CULTURELLE PO) Take 1 capsule by mouth 2 (two) times daily.    . montelukast (SINGULAIR) 10 MG tablet Take 10 mg by mouth at bedtime.    . pantoprazole (PROTONIX) 40 MG tablet Take 40 mg by mouth daily.    . predniSONE (DELTASONE) 20 MG tablet Take 40 mg by mouth daily with breakfast.    . senna (SENOKOT) 8.6 MG tablet Take 2 tablets by mouth daily.    Marland Kitchen Umeclidinium-Vilanterol (ANORO ELLIPTA) 62.5-25 MCG/INH AEPB Inhale 2 puffs into the lungs once. Only open the device one time and then take your two separate  drags to be sure you get it all     No current facility-administered medications for this visit.     Functional Status:  In your present state of health, do you have any difficulty performing the following activities: 10/31/2016 10/01/2016  Hearing? N N  Vision? N N  Difficulty concentrating or making decisions? N N  Walking or climbing stairs? - Y  Dressing or bathing? Y Y  Doing errands, shopping? Tempie Donning  Preparing Food and eating ? - Y  Using the Toilet? - N  In the past six months, have you accidently leaked urine? - N  Do you have problems with loss of bowel control? - N  Managing your Medications? - N  Managing your Finances? - N  Housekeeping or managing your Housekeeping? - N  Some recent data might be hidden    Fall/Depression Screening: PHQ 2/9 Scores 10/01/2016 08/24/2016 06/25/2016 05/25/2016 04/27/2016  PHQ - 2 Score 3 6 6 6 1   PHQ- 9 Score 10 14 14 13  -   THN CM Care Plan Problem One     Most Recent Value  Care Plan Problem One  Knowledge Deficit in self management of COPD  Role Documenting the Problem One  [place] for Problem One  Active  THN Long Term Goal (31-90 days)  Patient will not have any admissions for  COPD within the next 56 days  THN Long Term Goal Start Date  10/31/16  Interventions for Problem One [place] reminded patient and wife to keep appointments with PCP and [place] reminded patient and wife importance of taking medications as per physician order. RN Health Coach will be monitoring patient monthly  THN CM Short Term Goal #4 (0-30 days)  Patient will appetite increasing within the next 30 days  Interventions for Short Term Goal #4  Patient has started receiving ensure through [facility]. RN discussed withwife ways to make milkshakes. Rn will follow upwithin the next outreach       Assessment: Patient has not had any ED visits within the past year Patient had not received nebulizer  supplies Patient will continue to  benefit from [place] telephonic outreach for education and support for COPD self management.   Plan:  RN called [facility] RN called DR office and spoke with nurse Nebulizer supplies are being renewed RN will follow up within the Month of April  Johny Shock BSN RN [place] Management [phone]

## 2016-11-02 DIAGNOSIS — J449 Chronic obstructive pulmonary disease, unspecified: Secondary | ICD-10-CM | POA: Diagnosis not present

## 2016-11-05 ENCOUNTER — Other Ambulatory Visit: Payer: Self-pay | Admitting: *Deleted

## 2016-11-05 NOTE — Patient Outreach (Signed)
[facility]) Care Management  11/05/2016  MIKIAS LANZ [birth year] [id number]  RN Health Coach received voicemail message from patient wife.  Hipaa compliance verified. Per patient wife the patient is having difficulty swallowing his medications. Patient wife requested a swallowing evaluation. Plan: RN Health Coach called [facility] office and left the  message for nurse RN called patient wife and made her aware of receiving the message and that the Dr office was notified.   [place] Care Management [phone]

## 2016-11-18 DIAGNOSIS — J449 Chronic obstructive pulmonary disease, unspecified: Secondary | ICD-10-CM | POA: Diagnosis not present

## 2016-11-18 DIAGNOSIS — J9610 Chronic respiratory failure, unspecified whether with hypoxia or hypercapnia: Secondary | ICD-10-CM | POA: Diagnosis not present

## 2016-11-18 DIAGNOSIS — R262 Difficulty in walking, not elsewhere classified: Secondary | ICD-10-CM | POA: Diagnosis not present

## 2016-11-30 ENCOUNTER — Other Ambulatory Visit: Payer: Self-pay | Admitting: *Deleted

## 2016-11-30 NOTE — Patient Outreach (Signed)
[facility]) Care Management  11/30/2016   Edgar Robinson Dec 06, 1945 [id number]  RN Health Coach telephone call to patient.  Hipaa compliance verified. Per patient wife he is doing the same. Patient wears 2 liters oxygen at all times. Patient takes medications as prescribed. Patient breathes mostly with his accessory muscles. Patient has not had any admissions to hospital in over a year. Patient has not had any ED visits in over a year.  Patient is drinking ensure that they are getting free from the food pantry that someone donates each month. Patient does get choked easily on regular foods. Patient had eye exam and has cataracts. Patient is looking at whether to have removed or not. Patient is at a high risk and this is being taken into consideration. Eye doctor will not do surgery until pulmonologist approves. Patient wife is caregiver. She is doing activities for stress to help be a better caregiver. .  Patient and wife have agreed to further outreach calls.      Current Medications:  Current Outpatient Prescriptions  Medication Sig Dispense Refill  . albuterol (PROVENTIL) (2.5 MG/3ML) 0.083% nebulizer solution Take 2.5 mg by nebulization every 6 (six) hours as needed for wheezing or shortness of breath.    Marland Kitchen albuterol (VENTOLIN HFA) 108 (90 BASE) MCG/ACT inhaler Inhale 2 puffs into the lungs every 6 (six) hours as needed for wheezing or shortness of breath.    Marland Kitchen aspirin 81 MG tablet Take 81 mg by mouth daily.    . Calcium Carbonate-Vitamin D (CALCIUM PLUS VITAMIN D PO) Take 1 tablet by mouth 2 (two) times daily.    Marland Kitchen guaiFENesin (MUCINEX) 600 MG 12 hr tablet Take 600 mg by mouth 2 (two) times daily.    . Lactobacillus Rhamnosus, GG, (CULTURELLE PO) Take 1 capsule by mouth 2 (two) times daily.    . montelukast (SINGULAIR) 10 MG tablet Take 10 mg by mouth at bedtime.    . pantoprazole (PROTONIX) 40 MG tablet Take 40 mg by mouth daily.    . predniSONE (DELTASONE) 20 MG tablet  Take 40 mg by mouth daily with breakfast.    . senna (SENOKOT) 8.6 MG tablet Take 2 tablets by mouth daily.    Marland Kitchen Umeclidinium-Vilanterol (ANORO ELLIPTA) 62.5-25 MCG/INH AEPB Inhale 2 puffs into the lungs once. Only open the device one time and then take your two separate drags to be sure you get it all     No current facility-administered medications for this visit.     Functional Status:  In your present state of health, do you have any difficulty performing the following activities: 11/30/2016 10/31/2016  Hearing? N N  Vision? N N  Difficulty concentrating or making decisions? N N  Walking or climbing stairs? Y -  Dressing or bathing? Y Y  Doing errands, shopping? Tempie Donning  Preparing Food and eating ? Y Y  Using the Toilet? N N  In the past six months, have you accidently leaked urine? N N  Do you have problems with loss of bowel control? N N  Managing your Medications? N N  Managing your Finances? N N  Housekeeping or managing your Housekeeping? N N  Some recent data might be hidden    Fall/Depression Screening: Fall Risk  11/30/2016 10/31/2016 10/01/2016  Falls in the past year? No No No  Risk for fall due to : Impaired balance/gait Impaired balance/gait Impaired balance/gait   PHQ 2/9 Scores 11/30/2016 10/01/2016 08/24/2016 06/25/2016 05/25/2016 04/27/2016  PHQ -  2 Score 6 3 6 6 6 1   PHQ- 9 Score 13 10 14 14 13  -   [facility] CM Care Plan Problem One     Most Recent Value  Care Plan Problem One  Knowledge Deficit in self management of COPD  Role Documenting the Problem One  [place] for Problem One  Active  THN Long Term Goal (31-90 days)  Patient will not have any admissions for COPD within the next 90 days  THN Long Term Goal Start Date  11/30/16  Interventions for Problem One [place] reminded patient and wife to keep appointments with PCP and Pulmonologist. [place] reminded patient and wife importance of taking medications as per physician  order. RN Health Coach will be monitoring patient monthly  THN CM Short Term Goal #4 (0-30 days)  Patient will appetite increasing within the next 30 days  THN CM Short Term Goal #4 Start Date  11/30/16  Interventions for Short Term Goal #4  Patient has started receiving ensure through [employer]. RN discussed withwife ways to make milkshakes. Rn will follow upwithin the next outreach       Assessment:  Patient has not had any admissions in over a year Patient will continue to benefit from [employer] telephonic outreach for education and support for COPD self management. Plan:  RN discussed potential cataract surgery RN discussed patient appetite and nutritional needs RN will follow up within the month of May  THN CM Care Plan Problem One     Most Recent Value  Care Plan Problem One  Knowledge Deficit in self management of COPD  Role Documenting the Problem One  [place] for Problem One  Active  THN Long Term Goal (31-90 days)  Patient will not have any admissions for COPD within the next 90 days  THN Long Term Goal Start Date  11/30/16  Interventions for Problem One [place] reminded patient and wife to keep appointments with PCP and Pulmonologist. [place] reminded patient and wife importance of taking medications as per physician order. RN Health Coach will be monitoring patient monthly  THN CM Short Term Goal #4 (0-30 days)  Patient will appetite increasing within the next 30 days  THN CM Short Term Goal #4 Start Date  11/30/16  Interventions for Short Term Goal #4  Patient has started receiving ensure through [employer]. RN discussed withwife ways to make milkshakes. Rn will follow upwithin the next outreach    [place] Management [phone]

## 2016-12-18 DIAGNOSIS — J449 Chronic obstructive pulmonary disease, unspecified: Secondary | ICD-10-CM | POA: Diagnosis not present

## 2016-12-18 DIAGNOSIS — R262 Difficulty in walking, not elsewhere classified: Secondary | ICD-10-CM | POA: Diagnosis not present

## 2016-12-18 DIAGNOSIS — J9610 Chronic respiratory failure, unspecified whether with hypoxia or hypercapnia: Secondary | ICD-10-CM | POA: Diagnosis not present

## 2016-12-28 ENCOUNTER — Ambulatory Visit: Payer: Self-pay | Admitting: *Deleted

## 2017-01-01 ENCOUNTER — Other Ambulatory Visit: Payer: Self-pay | Admitting: *Deleted

## 2017-01-01 ENCOUNTER — Ambulatory Visit: Payer: Self-pay | Admitting: *Deleted

## 2017-01-01 ENCOUNTER — Encounter: Payer: Self-pay | Admitting: *Deleted

## 2017-01-01 DIAGNOSIS — J449 Chronic obstructive pulmonary disease, unspecified: Secondary | ICD-10-CM | POA: Diagnosis not present

## 2017-01-01 DIAGNOSIS — M8580 Other specified disorders of bone density and structure, unspecified site: Secondary | ICD-10-CM | POA: Diagnosis not present

## 2017-01-01 DIAGNOSIS — Z681 Body mass index (BMI) 19 or less, adult: Secondary | ICD-10-CM | POA: Diagnosis not present

## 2017-01-01 DIAGNOSIS — J9610 Chronic respiratory failure, unspecified whether with hypoxia or hypercapnia: Secondary | ICD-10-CM | POA: Diagnosis not present

## 2017-01-01 DIAGNOSIS — K219 Gastro-esophageal reflux disease without esophagitis: Secondary | ICD-10-CM | POA: Diagnosis not present

## 2017-01-01 DIAGNOSIS — R972 Elevated prostate specific antigen [PSA]: Secondary | ICD-10-CM | POA: Diagnosis not present

## 2017-01-01 NOTE — Patient Outreach (Signed)
[name]) Care Management  01/01/2017   Edgar Robinson March 03, 1946 [id number]  RN Health Coach telephone call to patient.  Hipaa compliance verified. Patient answered phone and passed to wife. Per  Wife he is doing fair. Patient does not want to go anywhere. Wife who is caregiver is trying to get patient to go and sit at the garden while she is planting. Patient is on O2 2L per N/C. Patient is Stage IV COPD. He is at a level where he is maintaining his health. His wife is giving him Ensure nutritional supplements. Patient has not had any ED visits or admissions. Patient wife is very receptive to any information that can assist her in learning more.  Patient and wife have agreed to follow up outreach calls.   Current Medications:  Current Outpatient Prescriptions  Medication Sig Dispense Refill  . albuterol (PROVENTIL) (2.5 MG/3ML) 0.083% nebulizer solution Take 2.5 mg by nebulization every 6 (six) hours as needed for wheezing or shortness of breath.    Marland Kitchen albuterol (VENTOLIN HFA) 108 (90 BASE) MCG/ACT inhaler Inhale 2 puffs into the lungs every 6 (six) hours as needed for wheezing or shortness of breath.    Marland Kitchen aspirin 81 MG tablet Take 81 mg by mouth daily.    . Calcium Carbonate-Vitamin D (CALCIUM PLUS VITAMIN D PO) Take 1 tablet by mouth 2 (two) times daily.    Marland Kitchen guaiFENesin (MUCINEX) 600 MG 12 hr tablet Take 600 mg by mouth 2 (two) times daily.    . Lactobacillus Rhamnosus, GG, (CULTURELLE PO) Take 1 capsule by mouth 2 (two) times daily.    . montelukast (SINGULAIR) 10 MG tablet Take 10 mg by mouth at bedtime.    . pantoprazole (PROTONIX) 40 MG tablet Take 40 mg by mouth daily.    . predniSONE (DELTASONE) 20 MG tablet Take 40 mg by mouth daily with breakfast.    . senna (SENOKOT) 8.6 MG tablet Take 2 tablets by mouth daily.    Marland Kitchen Umeclidinium-Vilanterol (ANORO ELLIPTA) 62.5-25 MCG/INH AEPB Inhale 2 puffs into the lungs once. Only open the device one time and then take your  two separate drags to be sure you get it all     No current facility-administered medications for this visit.     Functional Status:  In your present state of health, do you have any difficulty performing the following activities: 01/01/2017 11/30/2016  Hearing? N N  Vision? N N  Difficulty concentrating or making decisions? N N  Walking or climbing stairs? Y Y  Dressing or bathing? Y Y  Doing errands, shopping? Tempie Donning  Preparing Food and eating ? - Y  Using the Toilet? - N  In the past six months, have you accidently leaked urine? - N  Do you have problems with loss of bowel control? - N  Managing your Medications? - N  Managing your Finances? - N  Housekeeping or managing your Housekeeping? - N  Some recent data might be hidden    Fall/Depression Screening: Fall Risk  01/01/2017 11/30/2016 10/31/2016  Falls in the past year? - No No  Risk for fall due to : Impaired balance/gait Impaired balance/gait Impaired balance/gait   PHQ 2/9 Scores 01/01/2017 11/30/2016 10/01/2016 08/24/2016 06/25/2016 05/25/2016 04/27/2016  PHQ - 2 Score 6 6 3 6 6 6 1   PHQ- 9 Score 13 13 10 14 14 13  -   THN CM Care Plan Problem One     Most Recent Value  Care Plan Problem  One  Knowledge Deficit in self management of COPD  Role Documenting the Problem One  [place] for Problem One  Active  THN Long Term Goal   Patient will not have any admissions for COPD within the next 90 days  THN Long Term Goal Start Date  01/01/17  Interventions for Problem One [place] reminded patient and wife to keep appointments with PCP and [place] reminded patient and wife importance of taking medications as per physician order. RN Health Coach will be monitoring patient monthly  THN CM Short Term Goal #1   Patient and wife will verbalize understanding of preventing heat exhaustion and staying hydrated  THN CM Short Term Goal #1 Start Date  01/01/17  Interventions for Short  Term Goal #1  RN sent educationational material on The Elderly and heat exhaustion. Elderly and rehydration. RN will follow up with discussion and teachback   THN CM Short Term Goal #4 Start Date  01/01/17  Interventions for Short Term Goal #4  Patient has started receiving ensure through [employer]. RN discussed withwife ways to make milkshakes. Rn will follow upwithin the next outreach      Assessment:  Patient is maintaining health No ed visits or admissions since last outreach Patient will continue to benefit from [employer] telephonic outreach for education and support for COPD self management.   Plan:  RN sent patient educational material on Elderly and heat exhaustion RN sent educational material on elderly and rehydration RN discussed adhering to Dr appointment RN will follow up within the month of June  Kaydenn Mclear [place] Management [phone]

## 2017-01-18 DIAGNOSIS — R262 Difficulty in walking, not elsewhere classified: Secondary | ICD-10-CM | POA: Diagnosis not present

## 2017-01-18 DIAGNOSIS — J9610 Chronic respiratory failure, unspecified whether with hypoxia or hypercapnia: Secondary | ICD-10-CM | POA: Diagnosis not present

## 2017-01-18 DIAGNOSIS — J449 Chronic obstructive pulmonary disease, unspecified: Secondary | ICD-10-CM | POA: Diagnosis not present

## 2017-02-01 ENCOUNTER — Other Ambulatory Visit: Payer: Self-pay | Admitting: *Deleted

## 2017-02-01 NOTE — Patient Outreach (Signed)
Edgar [place] [facility]) Care Management  02/01/2017  Edgar Robinson 19-Dec-1945 [id number]  RN Health Coach attempted follow up outreach call to patient.  Patient was unavailable. HIPPA compliance voicemail message left with return callback number.  Plan: RN will call patient again within 14 days.  [facility] [phone]

## 2017-02-08 ENCOUNTER — Ambulatory Visit: Payer: Self-pay | Admitting: *Deleted

## 2017-02-17 DIAGNOSIS — J449 Chronic obstructive pulmonary disease, unspecified: Secondary | ICD-10-CM | POA: Diagnosis not present

## 2017-02-17 DIAGNOSIS — R262 Difficulty in walking, not elsewhere classified: Secondary | ICD-10-CM | POA: Diagnosis not present

## 2017-02-17 DIAGNOSIS — J9610 Chronic respiratory failure, unspecified whether with hypoxia or hypercapnia: Secondary | ICD-10-CM | POA: Diagnosis not present

## 2017-03-11 ENCOUNTER — Other Ambulatory Visit: Payer: Self-pay | Admitting: *Deleted

## 2017-03-11 NOTE — Patient Outreach (Signed)
[place] [facility]) Care Management  03/11/2017   TOMASZ STEEVES 04-22-1946 [id number]  RN Health Coach telephone call to patient.  Hipaa compliance verified. Patient stated he was doing good and passed phone to wife. Per wife patient has now stayed out of the hospital for COPD for 2 years. Patient condition has maintained at a stable point. Patient and caregiver Patient is taking medications as per ordered. Patient wife caregiver stated patient does not have any needs at this time. Caregiver will notify Health Coach if any needs or questions arrive. RN Health Coach informed caregiver of closing case at this time.   Current Medications:  Current Outpatient Prescriptions  Medication Sig Dispense Refill  . albuterol (PROVENTIL) (2.5 MG/3ML) 0.083% nebulizer solution Take 2.5 mg by nebulization every 6 (six) hours as needed for wheezing or shortness of breath.    Marland Kitchen albuterol (VENTOLIN HFA) 108 (90 BASE) MCG/ACT inhaler Inhale 2 puffs into the lungs every 6 (six) hours as needed for wheezing or shortness of breath.    Marland Kitchen aspirin 81 MG tablet Take 81 mg by mouth daily.    . Calcium Carbonate-Vitamin D (CALCIUM PLUS VITAMIN D PO) Take 1 tablet by mouth 2 (two) times daily.    Marland Kitchen guaiFENesin (MUCINEX) 600 MG 12 hr tablet Take 600 mg by mouth 2 (two) times daily.    . Lactobacillus Rhamnosus, GG, (CULTURELLE PO) Take 1 capsule by mouth 2 (two) times daily.    . montelukast (SINGULAIR) 10 MG tablet Take 10 mg by mouth at bedtime.    . pantoprazole (PROTONIX) 40 MG tablet Take 40 mg by mouth daily.    . predniSONE (DELTASONE) 20 MG tablet Take 40 mg by mouth daily with breakfast.    . senna (SENOKOT) 8.6 MG tablet Take 2 tablets by mouth daily.    Marland Kitchen Umeclidinium-Vilanterol (ANORO ELLIPTA) 62.5-25 MCG/INH AEPB Inhale 2 puffs into the lungs once. Only open the device one time and then take your two separate drags to be sure you get it all     No current facility-administered medications for this  visit.     Functional Status:  In your present state of health, do you have any difficulty performing the following activities: 03/11/2017 01/01/2017  Hearing? N N  Vision? N N  Difficulty concentrating or making decisions? N N  Walking or climbing stairs? Y Y  Dressing or bathing? Y Y  Doing errands, shopping? Y Y  Preparing Food and eating ? - -  Using the Toilet? - -  In the past six months, have you accidently leaked urine? - -  Do you have problems with loss of bowel control? - -  Managing your Medications? - -  Managing your Finances? - -  Housekeeping or managing your Housekeeping? - -  Some recent data might be hidden    Fall/Depression Screening: Fall Risk  03/11/2017 01/01/2017 11/30/2016  Falls in the past year? - - No  Risk for fall due to : Impaired balance/gait Impaired balance/gait Impaired balance/gait   PHQ 2/9 Scores 03/11/2017 01/01/2017 11/30/2016 10/01/2016 08/24/2016 06/25/2016 05/25/2016  PHQ - 2 Score 6 6 6 3 6 6 6   PHQ- 9 Score 13 13 13 10 14 14 13    THN CM Care Plan Problem One     Most Recent Value  Care Plan Problem One  Knowledge Deficit in self management of COPD  Role Documenting the Problem One  [place] for Problem One  Active  [facility]  Long Term Goal   Patient will not have any admissions for COPD within the next 90 days  THN Long Term Goal Met Date  03/11/17  Interventions for Problem One [place] reminded patient and wife to keep appointments with PCP and [place] reminded patient and wife importance of taking medications as per physician order. RN Health Coach will be monitoring patient monthly  THN CM Short Term Goal #1   Patient and wife will verbalize understanding of preventing heat exhaustion and staying hydrated  THN CM Short Term Goal #1 Met Date  03/11/17  Interventions for Short Term Goal #1  RN sent educationational material on The Elderly and heat exhaustion. Elderly and rehydration. RN  will follow up with discussion and teachback   THN CM Short Term Goal #4  Patient will appetite increasing within the next 30 days  Interventions for Short Term Goal #4  Patient has started receiving ensure through [employer]. RN discussed withwife ways to make milkshakes. Rn will follow upwithin the next outreach      Assessment:  Patient condition has reached a plateau Patient has not had any admissions for COPD in 2 years Patient has no further needs at this time Plan:  Case closure at this time Patient has met [place] Management [phone]

## 2017-03-12 ENCOUNTER — Encounter: Payer: Self-pay | Admitting: *Deleted

## 2017-03-20 DIAGNOSIS — R262 Difficulty in walking, not elsewhere classified: Secondary | ICD-10-CM | POA: Diagnosis not present

## 2017-03-20 DIAGNOSIS — J449 Chronic obstructive pulmonary disease, unspecified: Secondary | ICD-10-CM | POA: Diagnosis not present

## 2017-03-20 DIAGNOSIS — J9610 Chronic respiratory failure, unspecified whether with hypoxia or hypercapnia: Secondary | ICD-10-CM | POA: Diagnosis not present

## 2017-04-03 DIAGNOSIS — Z681 Body mass index (BMI) 19 or less, adult: Secondary | ICD-10-CM | POA: Diagnosis not present

## 2017-04-03 DIAGNOSIS — M810 Age-related osteoporosis without current pathological fracture: Secondary | ICD-10-CM | POA: Diagnosis not present

## 2017-04-03 DIAGNOSIS — J9610 Chronic respiratory failure, unspecified whether with hypoxia or hypercapnia: Secondary | ICD-10-CM | POA: Diagnosis not present

## 2017-04-03 DIAGNOSIS — J449 Chronic obstructive pulmonary disease, unspecified: Secondary | ICD-10-CM | POA: Diagnosis not present

## 2017-04-03 DIAGNOSIS — K219 Gastro-esophageal reflux disease without esophagitis: Secondary | ICD-10-CM | POA: Diagnosis not present

## 2017-04-20 DIAGNOSIS — R262 Difficulty in walking, not elsewhere classified: Secondary | ICD-10-CM | POA: Diagnosis not present

## 2017-04-20 DIAGNOSIS — J9610 Chronic respiratory failure, unspecified whether with hypoxia or hypercapnia: Secondary | ICD-10-CM | POA: Diagnosis not present

## 2017-04-20 DIAGNOSIS — J449 Chronic obstructive pulmonary disease, unspecified: Secondary | ICD-10-CM | POA: Diagnosis not present

## 2017-05-20 DIAGNOSIS — J9610 Chronic respiratory failure, unspecified whether with hypoxia or hypercapnia: Secondary | ICD-10-CM | POA: Diagnosis not present

## 2017-05-20 DIAGNOSIS — J449 Chronic obstructive pulmonary disease, unspecified: Secondary | ICD-10-CM | POA: Diagnosis not present

## 2017-05-20 DIAGNOSIS — R262 Difficulty in walking, not elsewhere classified: Secondary | ICD-10-CM | POA: Diagnosis not present

## 2017-06-20 DIAGNOSIS — R262 Difficulty in walking, not elsewhere classified: Secondary | ICD-10-CM | POA: Diagnosis not present

## 2017-06-20 DIAGNOSIS — J449 Chronic obstructive pulmonary disease, unspecified: Secondary | ICD-10-CM | POA: Diagnosis not present

## 2017-06-20 DIAGNOSIS — J9610 Chronic respiratory failure, unspecified whether with hypoxia or hypercapnia: Secondary | ICD-10-CM | POA: Diagnosis not present

## 2017-07-04 DIAGNOSIS — E559 Vitamin D deficiency, unspecified: Secondary | ICD-10-CM | POA: Diagnosis not present

## 2017-07-04 DIAGNOSIS — Z125 Encounter for screening for malignant neoplasm of prostate: Secondary | ICD-10-CM | POA: Diagnosis not present

## 2017-07-04 DIAGNOSIS — Z23 Encounter for immunization: Secondary | ICD-10-CM | POA: Diagnosis not present

## 2017-07-04 DIAGNOSIS — M8580 Other specified disorders of bone density and structure, unspecified site: Secondary | ICD-10-CM | POA: Diagnosis not present

## 2017-07-04 DIAGNOSIS — Z9181 History of falling: Secondary | ICD-10-CM | POA: Diagnosis not present

## 2017-07-04 DIAGNOSIS — Z79899 Other long term (current) drug therapy: Secondary | ICD-10-CM | POA: Diagnosis not present

## 2017-07-04 DIAGNOSIS — Z139 Encounter for screening, unspecified: Secondary | ICD-10-CM | POA: Diagnosis not present

## 2017-07-04 DIAGNOSIS — K219 Gastro-esophageal reflux disease without esophagitis: Secondary | ICD-10-CM | POA: Diagnosis not present

## 2017-07-04 DIAGNOSIS — J449 Chronic obstructive pulmonary disease, unspecified: Secondary | ICD-10-CM | POA: Diagnosis not present

## 2017-07-04 DIAGNOSIS — J9610 Chronic respiratory failure, unspecified whether with hypoxia or hypercapnia: Secondary | ICD-10-CM | POA: Diagnosis not present

## 2017-07-04 DIAGNOSIS — R972 Elevated prostate specific antigen [PSA]: Secondary | ICD-10-CM | POA: Diagnosis not present

## 2017-07-04 DIAGNOSIS — Z0000 Encounter for general adult medical examination without abnormal findings: Secondary | ICD-10-CM | POA: Diagnosis not present

## 2017-10-03 DIAGNOSIS — J9610 Chronic respiratory failure, unspecified whether with hypoxia or hypercapnia: Secondary | ICD-10-CM | POA: Diagnosis not present

## 2017-10-03 DIAGNOSIS — K219 Gastro-esophageal reflux disease without esophagitis: Secondary | ICD-10-CM | POA: Diagnosis not present

## 2017-10-03 DIAGNOSIS — J449 Chronic obstructive pulmonary disease, unspecified: Secondary | ICD-10-CM | POA: Diagnosis not present

## 2017-10-03 DIAGNOSIS — M8580 Other specified disorders of bone density and structure, unspecified site: Secondary | ICD-10-CM | POA: Diagnosis not present

## 2017-10-03 DIAGNOSIS — R000 Tachycardia, unspecified: Secondary | ICD-10-CM | POA: Diagnosis not present

## 2018-01-02 DIAGNOSIS — J9610 Chronic respiratory failure, unspecified whether with hypoxia or hypercapnia: Secondary | ICD-10-CM | POA: Diagnosis not present

## 2018-01-02 DIAGNOSIS — J441 Chronic obstructive pulmonary disease with (acute) exacerbation: Secondary | ICD-10-CM | POA: Diagnosis not present

## 2018-01-02 DIAGNOSIS — M8580 Other specified disorders of bone density and structure, unspecified site: Secondary | ICD-10-CM | POA: Diagnosis not present

## 2018-01-02 DIAGNOSIS — K219 Gastro-esophageal reflux disease without esophagitis: Secondary | ICD-10-CM | POA: Diagnosis not present

## 2018-01-02 DIAGNOSIS — R000 Tachycardia, unspecified: Secondary | ICD-10-CM | POA: Diagnosis not present

## 2018-01-02 DIAGNOSIS — R972 Elevated prostate specific antigen [PSA]: Secondary | ICD-10-CM | POA: Diagnosis not present

## 2018-01-02 DIAGNOSIS — Z681 Body mass index (BMI) 19 or less, adult: Secondary | ICD-10-CM | POA: Diagnosis not present

## 2018-01-02 DIAGNOSIS — R634 Abnormal weight loss: Secondary | ICD-10-CM | POA: Diagnosis not present

## 2018-01-02 DIAGNOSIS — F329 Major depressive disorder, single episode, unspecified: Secondary | ICD-10-CM | POA: Diagnosis not present

## 2018-01-31 DIAGNOSIS — J449 Chronic obstructive pulmonary disease, unspecified: Secondary | ICD-10-CM | POA: Diagnosis not present

## 2018-01-31 DIAGNOSIS — R634 Abnormal weight loss: Secondary | ICD-10-CM | POA: Diagnosis not present

## 2018-01-31 DIAGNOSIS — Z1339 Encounter for screening examination for other mental health and behavioral disorders: Secondary | ICD-10-CM | POA: Diagnosis not present

## 2018-01-31 DIAGNOSIS — F329 Major depressive disorder, single episode, unspecified: Secondary | ICD-10-CM | POA: Diagnosis not present

## 2018-02-13 ENCOUNTER — Observation Stay
Admission: EM | Admit: 2018-02-13 | Discharge: 2018-02-16 | Disposition: A | Discharge status: Hospice | Payer: Medicare HMO | Attending: Internal Medicine | Admitting: Internal Medicine

## 2018-02-13 ENCOUNTER — Emergency Department: Payer: Medicare HMO

## 2018-02-13 ENCOUNTER — Other Ambulatory Visit: Payer: Self-pay

## 2018-02-13 DIAGNOSIS — Z681 Body mass index (BMI) 19 or less, adult: Secondary | ICD-10-CM | POA: Diagnosis not present

## 2018-02-13 DIAGNOSIS — Z87891 Personal history of nicotine dependence: Secondary | ICD-10-CM | POA: Diagnosis not present

## 2018-02-13 DIAGNOSIS — J449 Chronic obstructive pulmonary disease, unspecified: Secondary | ICD-10-CM | POA: Diagnosis present

## 2018-02-13 DIAGNOSIS — Z9981 Dependence on supplemental oxygen: Secondary | ICD-10-CM | POA: Insufficient documentation

## 2018-02-13 DIAGNOSIS — E43 Unspecified severe protein-calorie malnutrition: Secondary | ICD-10-CM | POA: Insufficient documentation

## 2018-02-13 DIAGNOSIS — Z79899 Other long term (current) drug therapy: Secondary | ICD-10-CM | POA: Diagnosis not present

## 2018-02-13 DIAGNOSIS — M810 Age-related osteoporosis without current pathological fracture: Secondary | ICD-10-CM | POA: Diagnosis not present

## 2018-02-13 DIAGNOSIS — R0600 Dyspnea, unspecified: Secondary | ICD-10-CM | POA: Diagnosis not present

## 2018-02-13 DIAGNOSIS — Z7982 Long term (current) use of aspirin: Secondary | ICD-10-CM | POA: Insufficient documentation

## 2018-02-13 DIAGNOSIS — R0603 Acute respiratory distress: Secondary | ICD-10-CM

## 2018-02-13 DIAGNOSIS — M6250 Muscle wasting and atrophy, not elsewhere classified, unspecified site: Secondary | ICD-10-CM | POA: Diagnosis not present

## 2018-02-13 DIAGNOSIS — J441 Chronic obstructive pulmonary disease with (acute) exacerbation: Secondary | ICD-10-CM | POA: Diagnosis not present

## 2018-02-13 DIAGNOSIS — F329 Major depressive disorder, single episode, unspecified: Secondary | ICD-10-CM | POA: Insufficient documentation

## 2018-02-13 DIAGNOSIS — K219 Gastro-esophageal reflux disease without esophagitis: Secondary | ICD-10-CM | POA: Insufficient documentation

## 2018-02-13 DIAGNOSIS — Z66 Do not resuscitate: Secondary | ICD-10-CM | POA: Diagnosis not present

## 2018-02-13 DIAGNOSIS — Z7952 Long term (current) use of systemic steroids: Secondary | ICD-10-CM | POA: Diagnosis not present

## 2018-02-13 DIAGNOSIS — R0602 Shortness of breath: Secondary | ICD-10-CM | POA: Diagnosis not present

## 2018-02-13 DIAGNOSIS — Z8674 Personal history of sudden cardiac arrest: Secondary | ICD-10-CM | POA: Insufficient documentation

## 2018-02-13 DIAGNOSIS — R000 Tachycardia, unspecified: Secondary | ICD-10-CM | POA: Diagnosis not present

## 2018-02-13 LAB — BASIC METABOLIC PANEL
Anion gap: 9 (ref 5–15)
BUN: 15 mg/dL (ref 8–23)
CO2: 36 mmol/L — ABNORMAL HIGH (ref 22–32)
Calcium: 9.3 mg/dL (ref 8.9–10.3)
Chloride: 100 mmol/L (ref 98–111)
Creatinine, Ser: 0.66 mg/dL (ref 0.61–1.24)
GFR calc Af Amer: >60 mL/min (ref >60)
GLUCOSE: 124 mg/dL — AB (ref 70–99)
POTASSIUM: 3.7 mmol/L (ref 3.5–5.1)
Sodium: 145 mmol/L (ref 135–145)

## 2018-02-13 LAB — TROPONIN I: Troponin I: <0.03 ng/mL (ref <0.03)

## 2018-02-13 LAB — CBC
HCT: 38.6 % — ABNORMAL LOW (ref 40.0–52.0)
Hemoglobin: 12.6 g/dL — ABNORMAL LOW (ref 13.0–18.0)
MCH: 30.4 pg (ref 26.0–34.0)
MCHC: 32.7 g/dL (ref 32.0–36.0)
MCV: 92.9 fL (ref 80.0–100.0)
Platelets: 265 10*3/uL (ref 150–440)
RBC: 4.16 MIL/uL — ABNORMAL LOW (ref 4.40–5.90)
RDW: 15 % — ABNORMAL HIGH (ref 11.5–14.5)
WBC: 16 10*3/uL — ABNORMAL HIGH (ref 3.8–10.6)

## 2018-02-13 MED ORDER — METHYLPREDNISOLONE SODIUM SUCC 125 MG IJ SOLR
125.0000 mg | Freq: Once | INTRAMUSCULAR | Status: AC
  Administered 2018-02-13: 125 mg via INTRAVENOUS
  Filled 2018-02-13: qty 2

## 2018-02-13 MED ORDER — IPRATROPIUM-ALBUTEROL 0.5-2.5 (3) MG/3ML IN SOLN
3.0000 mL | Freq: Once | RESPIRATORY_TRACT | Status: AC
  Administered 2018-02-13: 3 mL via RESPIRATORY_TRACT
  Filled 2018-02-13: qty 3

## 2018-02-13 NOTE — ED Provider Notes (Signed)
[facility] Emergency Department Provider Note   Time Seen: 11:00 PM I have reviewed the triage vital signs and the nursing notes.   HISTORY  Chief Complaint Shortness of Breath    HPI Edgar Robinson is a 72 y.o. male with below list of chronic medical conditions including COPD presents to the emergency department with a 4-day history of progressive dyspnea and cough.  Patient denies any fever.  Patient denies any chest pain.  Patient denies any lower extremity swelling.  Past Medical History:  Diagnosis Date  . COPD (chronic obstructive pulmonary disease) ([place])   . Depression   . GERD (gastroesophageal reflux disease)   . Osteoporosis   . Oxygen deficiency     Patient Active Problem List   Diagnosis Date Noted  . Chronic respiratory failure with hypercapnia ([place]) 02/02/2015  . Borderline abnormal TFTs 02/02/2015  . COPD Severe/ 02 dep  02/01/2015  . Dyspnea 02/01/2015    History reviewed. No pertinent surgical history.  Prior to Admission medications   Medication Sig Start Date End Date Taking? Authorizing Provider  albuterol (PROVENTIL) (2.5 MG/3ML) 0.083% nebulizer solution Take 2.5 mg by nebulization every 6 (six) hours as needed for wheezing or shortness of breath.    [provider]  albuterol (VENTOLIN HFA) 108 (90 BASE) MCG/ACT inhaler Inhale 2 puffs into the lungs every 6 (six) hours as needed for wheezing or shortness of breath.    [provider]  aspirin 81 MG tablet Take 81 mg by mouth daily.    [provider]  Calcium Carbonate-Vitamin D (CALCIUM PLUS VITAMIN D PO) Take 1 tablet by mouth 2 (two) times daily.    [provider]  guaiFENesin (MUCINEX) 600 MG 12 hr tablet Take 600 mg by mouth 2 (two) times daily.    [provider]  Lactobacillus Rhamnosus, GG, (CULTURELLE PO) Take 1 capsule by mouth 2 (two) times daily.    [provider]  montelukast (SINGULAIR) 10 MG tablet Take 10  mg by mouth at bedtime.    [provider]  pantoprazole (PROTONIX) 40 MG tablet Take 40 mg by mouth daily.    [provider]  predniSONE (DELTASONE) 20 MG tablet Take 40 mg by mouth daily with breakfast.    [provider]  senna (SENOKOT) 8.6 MG tablet Take 2 tablets by mouth daily.    [provider]  Umeclidinium-Vilanterol (ANORO ELLIPTA) 62.5-25 MCG/INH AEPB Inhale 2 puffs into the lungs once. Only open the device one time and then take your two separate drags to be sure you get it all 02/01/15   Tanda Rockers, MD    Allergies No known drug allergies  Family History  Problem Relation Age of Onset  . Allergies Sister   . Asthma Sister   . Asthma Son   . Cancer Sister        ? type    Social History Social History   Tobacco Use  . Smoking status: Former Smoker    Packs/day: 1.00    Years: 20.00    Pack years: 20.00    Types: Cigarettes    Last attempt to quit: 07/07/2007    Years since quitting: 10.6  . Smokeless tobacco: Never Used  Substance Use Topics  . Alcohol use: No    Alcohol/week: 0.0 oz  . Drug use: No    Review of Systems Constitutional: No fever/chills Eyes: No visual changes. ENT: No sore throat. Cardiovascular: Denies chest pain. Respiratory:  Positive for dyspnea and cough Gastrointestinal: No abdominal pain.  No nausea, no vomiting.  No diarrhea.  No constipation. Genitourinary: Negative for dysuria. Musculoskeletal: Negative for neck pain.  Negative for back pain. Integumentary: Negative for rash. Neurological: Negative for headaches, focal weakness or numbness.   ____________________________________________   PHYSICAL EXAM:  VITAL SIGNS: ED Triage Vitals  Enc Vitals Group     BP 02/13/18 2225 (!) 147/87     Pulse Rate 02/13/18 2225 90     Resp 02/13/18 2225 19     Temp 02/13/18 2225 98.1 F (36.7 C)     Temp Source 02/13/18 2225 Oral     SpO2 02/13/18 2225 100 %     Weight 02/13/18 2228 44.5 kg  (98 lb)     Height 02/13/18 2228 1.676 m (5\' 6" )     Head Circumference --      Peak Flow --      Pain Score 02/13/18 2225 0     Pain Loc --      Pain Edu? --      Excl. in [place]? --     Constitutional: Alert and oriented.  Apparent increased work of breathing  eyes: Conjunctivae are normal.  Head: Atraumatic. Mouth/Throat: Mucous membranes are moist.  Oropharynx non-erythematous. Neck: No stridor.   Cardiovascular: Normal rate, regular rhythm. Good peripheral circulation. Grossly normal heart sounds. Respiratory: Expiratory wheezes.  Speaking full sentences.  No accessory respiratory muscle use. Gastrointestinal: Soft and nontender. No distention.  Musculoskeletal: No lower extremity tenderness nor edema. No gross deformities of extremities. Neurologic:  Normal speech and language. No gross focal neurologic deficits are appreciated.  Skin:  Skin is warm, dry and intact. No rash noted. Psychiatric: Mood and affect are normal. Speech and behavior are normal.  ____________________________________________   LABS (all labs ordered are listed, but only abnormal results are displayed)  Labs Reviewed  BASIC METABOLIC PANEL - Abnormal; Notable for the following components:      Result Value   CO2 36 (*)    Glucose, Bld 124 (*)    All other components within normal limits  CBC - Abnormal; Notable for the following components:   WBC 16.0 (*)    RBC 4.16 (*)    Hemoglobin 12.6 (*)    HCT 38.6 (*)    RDW 15.0 (*)    All other components within normal limits  TROPONIN I   ____________________________________________  EKG  ED ECG REPORT I, [place] Aaniyah Strohm, the attending physician, personally viewed and interpreted this ECG.   Date: 02/13/2018  EKG Time: 10:27 PM  Rate: 90  Rhythm: Normal sinus rhythm  Axis: Normal  Intervals: Normal  ST&T Change: None  ____________________________________________  RADIOLOGY I, [place] N Joffre Lucks, personally viewed and evaluated these images  (plain radiographs) as part of my medical decision making, as well as reviewing the written report by the radiologist.  ED MD interpretation: Chest x-[place] consistent with COPD  Official radiology report(s): Dg Chest 2 View  Result Date: 02/13/2018 CLINICAL DATA:  72 y/o M; 4 days of shortness of breath. History of COPD and smoking. EXAM: CHEST - 2 VIEW COMPARISON:  07/04/2015 chest radiograph FINDINGS: Stable scarring in the right lung apex. Emphysema with hyperinflated lungs. No consolidation, effusion, or pneumothorax. Stable normal cardiac silhouette given projection and technique. No acute osseous abnormality is evident. 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 No acute pulmonary process identified. Electronically Signed   By: Kristine Garbe M.D.   On:  02/13/2018 22:59     Procedures   ____________________________________________   INITIAL IMPRESSION / ASSESSMENT AND PLAN / ED COURSE  As part of my medical decision making, I reviewed the following data within the electronic MEDICAL RECORD NUMBER   72 year old male presenting with above-stated history and physical exam secondary to dyspnea.  Consider the possibility of COPD exacerbation versus pneumonia.  Patient given a DuoNeb as well as 125 mg of Solu-Medrol following my evaluation.  On reevaluation the patient's symptoms improved however with ambulation patient's dyspnea markedly worsened with resultant hypoxia.  Patient given an additional DuoNeb treatment.  Patient discussed with Dr. Aliene Altes for hospital admission further evaluation and management    ____________________________________________  FINAL CLINICAL IMPRESSION(S) / ED DIAGNOSES  COPD exacerbation  MEDICATIONS GIVEN DURING THIS VISIT:  Medications - No data to display   ED Discharge Orders    None       Note:  This document was prepared using Dragon voice recognition software and may include unintentional dictation errors.       Gregor Hams, MD 02/14/18 2249

## 2018-02-13 NOTE — ED Notes (Signed)
Pt taken to xray via stretcher  

## 2018-02-13 NOTE — ED Triage Notes (Signed)
Pt arrives [facility] for respiratory complaint. Pt arrives from home where he normally wears 2 L. EMS placed on 3 L. SOB x 4 days. Hx COPD. VSS. Alert and oriented upon arrival.

## 2018-02-14 DIAGNOSIS — R0603 Acute respiratory distress: Secondary | ICD-10-CM

## 2018-02-14 DIAGNOSIS — Z7189 Other specified counseling: Secondary | ICD-10-CM

## 2018-02-14 DIAGNOSIS — J441 Chronic obstructive pulmonary disease with (acute) exacerbation: Secondary | ICD-10-CM | POA: Diagnosis present

## 2018-02-14 DIAGNOSIS — J449 Chronic obstructive pulmonary disease, unspecified: Secondary | ICD-10-CM | POA: Diagnosis not present

## 2018-02-14 DIAGNOSIS — F329 Major depressive disorder, single episode, unspecified: Secondary | ICD-10-CM | POA: Diagnosis not present

## 2018-02-14 DIAGNOSIS — Z515 Encounter for palliative care: Secondary | ICD-10-CM | POA: Diagnosis not present

## 2018-02-14 DIAGNOSIS — E43 Unspecified severe protein-calorie malnutrition: Secondary | ICD-10-CM

## 2018-02-14 LAB — URINALYSIS, COMPLETE (UACMP) WITH MICROSCOPIC
BILIRUBIN URINE: NEGATIVE
Bacteria, UA: NONE SEEN
GLUCOSE, UA: NEGATIVE mg/dL
Hgb urine dipstick: NEGATIVE
Ketones, ur: NEGATIVE mg/dL
LEUKOCYTES UA: NEGATIVE
NITRITE: NEGATIVE
PH: 7 (ref 5.0–8.0)
Protein, ur: NEGATIVE mg/dL
Specific Gravity, Urine: 1.013 (ref 1.005–1.030)

## 2018-02-14 LAB — HEMOGLOBIN A1C
Hgb A1c MFr Bld: 6.6 % — ABNORMAL HIGH (ref 4.8–5.6)
Mean Plasma Glucose: 142.72 mg/dL

## 2018-02-14 LAB — TSH: TSH: 1.575 u[IU]/mL (ref 0.350–4.500)

## 2018-02-14 MED ORDER — DOCUSATE SODIUM 100 MG PO CAPS
100.0000 mg | ORAL_CAPSULE | Freq: Two times a day (BID) | ORAL | Status: DC
  Administered 2018-02-14 – 2018-02-15 (×4): 100 mg via ORAL
  Filled 2018-02-14 (×4): qty 1

## 2018-02-14 MED ORDER — ENSURE ENLIVE PO LIQD
237.0000 mL | Freq: Three times a day (TID) | ORAL | Status: DC
  Administered 2018-02-14 – 2018-02-16 (×4): 237 mL via ORAL

## 2018-02-14 MED ORDER — ACETAMINOPHEN 650 MG RE SUPP: 650.0000 mg | Freq: Four times a day (QID) | RECTAL | Status: DC | PRN

## 2018-02-14 MED ORDER — PREDNISONE 50 MG PO TABS: 50.0000 mg | ORAL_TABLET | Freq: Every day | ORAL | Status: DC

## 2018-02-14 MED ORDER — METHYLPREDNISOLONE SODIUM SUCC 40 MG IJ SOLR
40.0000 mg | Freq: Every day | INTRAMUSCULAR | Status: DC
  Administered 2018-02-14 – 2018-02-15 (×2): 40 mg via INTRAVENOUS
  Filled 2018-02-14 (×2): qty 1

## 2018-02-14 MED ORDER — FLUTICASONE FUROATE-VILANTEROL 100-25 MCG/INH IN AEPB
1.0000 | INHALATION_SPRAY | Freq: Every day | RESPIRATORY_TRACT | Status: DC
  Administered 2018-02-14: 1 via RESPIRATORY_TRACT
  Filled 2018-02-14: qty 28

## 2018-02-14 MED ORDER — ONDANSETRON HCL 4 MG/2ML IJ SOLN: 4.0000 mg | Freq: Four times a day (QID) | INTRAMUSCULAR | Status: DC | PRN

## 2018-02-14 MED ORDER — PREDNISONE 20 MG PO TABS: 40.0000 mg | ORAL_TABLET | Freq: Every day | ORAL | Status: DC

## 2018-02-14 MED ORDER — ADULT MULTIVITAMIN W/MINERALS CH
1.0000 | ORAL_TABLET | Freq: Every day | ORAL | Status: DC
  Administered 2018-02-14 – 2018-02-15 (×2): 1 via ORAL
  Filled 2018-02-14 (×2): qty 1

## 2018-02-14 MED ORDER — ORAL CARE MOUTH RINSE
15.0000 mL | Freq: Two times a day (BID) | OROMUCOSAL | Status: DC
  Administered 2018-02-14: 15 mL via OROMUCOSAL

## 2018-02-14 MED ORDER — ONDANSETRON HCL 4 MG PO TABS: 4.0000 mg | ORAL_TABLET | Freq: Four times a day (QID) | ORAL | Status: DC | PRN

## 2018-02-14 MED ORDER — IPRATROPIUM-ALBUTEROL 0.5-2.5 (3) MG/3ML IN SOLN
3.0000 mL | Freq: Four times a day (QID) | RESPIRATORY_TRACT | Status: DC
  Administered 2018-02-14 – 2018-02-16 (×6): 3 mL via RESPIRATORY_TRACT
  Filled 2018-02-14 (×7): qty 3

## 2018-02-14 MED ORDER — ALBUTEROL SULFATE (2.5 MG/3ML) 0.083% IN NEBU
2.5000 mg | INHALATION_SOLUTION | RESPIRATORY_TRACT | Status: DC | PRN
  Administered 2018-02-15: 2.5 mg via RESPIRATORY_TRACT
  Filled 2018-02-14: qty 3

## 2018-02-14 MED ORDER — BUDESONIDE 0.5 MG/2ML IN SUSP
0.5000 mg | Freq: Two times a day (BID) | RESPIRATORY_TRACT | Status: DC
  Administered 2018-02-14 – 2018-02-16 (×4): 0.5 mg via RESPIRATORY_TRACT
  Filled 2018-02-14 (×4): qty 2

## 2018-02-14 MED ORDER — ACETAMINOPHEN 325 MG PO TABS: 650.0000 mg | ORAL_TABLET | Freq: Four times a day (QID) | ORAL | Status: DC | PRN

## 2018-02-14 MED ORDER — ENOXAPARIN SODIUM 40 MG/0.4ML ~~LOC~~ SOLN
40.0000 mg | SUBCUTANEOUS | Status: DC
  Administered 2018-02-14 – 2018-02-15 (×2): 40 mg via SUBCUTANEOUS
  Filled 2018-02-14 (×2): qty 0.4

## 2018-02-14 MED ORDER — MORPHINE SULFATE (CONCENTRATE) 10 MG/0.5ML PO SOLN
5.0000 mg | ORAL | Status: DC | PRN
  Administered 2018-02-15: 5 mg via ORAL
  Filled 2018-02-14: qty 1

## 2018-02-14 NOTE — Care Management Obs Status (Signed)
[place] NOTIFICATION   Patient Details  Name: CLIFFTON SPRADLEY MRN: [id number] Date of Birth: [birth year]   Medicare Observation Status Notification Given:  Yes    Katrina Stack, RN 02/14/2018, 3:21 PM

## 2018-02-14 NOTE — Plan of Care (Signed)

## 2018-02-14 NOTE — Progress Notes (Signed)
Pt stated that he breathing fine on  3lpm n/c

## 2018-02-14 NOTE — H&P (Signed)
[place] Robinson is an 72 y.o. male.   Chief Complaint: Shortness of breath HPI: The patient with past medical history of COPD and cardiac arrest presents the emergency department complaining of shortness of breath.  The patient states that his dyspnea acutely worsened yesterday.  Usually his inhalers help relieve his shortness of breath but he felt as if he were only getting worse.  He denies chest pain but admits to fatigue.  The patient wears 2 L of oxygen via nasal cannula at all times.  Oxygen saturations were normal on the settings while at rest but the patient had significant desaturations while ambulating which prompted the emergency department staff call hospitalist service for admission  Past Medical History:  Diagnosis Date  . COPD (chronic obstructive pulmonary disease) ([place])   . Depression   . GERD (gastroesophageal reflux disease)   . Osteoporosis   . Oxygen deficiency     History reviewed. No pertinent surgical history.  Family History  Problem Relation Age of Onset  . Allergies Sister   . Asthma Sister   . Asthma Son   . Cancer Sister        ? type   Social History:  reports that he quit smoking about 10 years ago. His smoking use included cigarettes. He has a 20.00 pack-year smoking history. He has never used smokeless tobacco. He reports that he does not drink alcohol or use drugs.  Allergies: No Known Allergies  Medications Prior to Admission  Medication Sig Dispense Refill  . albuterol (PROVENTIL) (2.5 MG/3ML) 0.083% nebulizer solution Take 2.5 mg by nebulization every 6 (six) hours as needed for wheezing or shortness of breath.    Marland Kitchen albuterol (VENTOLIN HFA) 108 (90 BASE) MCG/ACT inhaler Inhale 2 puffs into the lungs every 6 (six) hours as needed for wheezing or shortness of breath.    Marland Kitchen aspirin 81 MG tablet Take 81 mg by mouth daily.    . Calcium Carbonate-Vitamin D (CALCIUM PLUS VITAMIN D PO) Take 1 tablet by mouth 2 (two) times daily.    Marland Kitchen guaiFENesin (MUCINEX) 600  MG 12 hr tablet Take 600 mg by mouth 2 (two) times daily.    . Lactobacillus Rhamnosus, GG, (CULTURELLE PO) Take 1 capsule by mouth 2 (two) times daily.    . montelukast (SINGULAIR) 10 MG tablet Take 10 mg by mouth at bedtime.    . pantoprazole (PROTONIX) 40 MG tablet Take 40 mg by mouth daily.    . predniSONE (DELTASONE) 20 MG tablet Take 40 mg by mouth daily with breakfast.    . senna (SENOKOT) 8.6 MG tablet Take 2 tablets by mouth daily.    Marland Kitchen Umeclidinium-Vilanterol (ANORO ELLIPTA) 62.5-25 MCG/INH AEPB Inhale 2 puffs into the lungs once. Only open the device one time and then take your two separate drags to be sure you get it all      Results for orders placed or performed during the hospital encounter of 02/13/18 (from the past 48 hour(s))  Basic metabolic panel     Status: Abnormal   Collection Time: 02/13/18 10:31 PM  Result Value Ref Range   Sodium 145 135 - 145 mmol/L   Potassium 3.7 3.5 - 5.1 mmol/L   Chloride 100 98 - 111 mmol/L    Comment: Please note change in reference range.   CO2 36 (H) 22 - 32 mmol/L   Glucose, Bld 124 (H) 70 - 99 mg/dL    Comment: Please note change in reference range.   BUN 15  8 - 23 mg/dL    Comment: Please note change in reference range.   Creatinine, Ser 0.66 0.61 - 1.24 mg/dL   Calcium 9.3 8.9 - 10.3 mg/dL   GFR calc non Af Amer >60 >60 mL/min   GFR calc Af Amer >60 >60 mL/min    Comment: (NOTE) The eGFR has been calculated using the CKD EPI equation. This calculation has not been validated in all clinical situations. eGFR's persistently <60 mL/min signify possible Chronic Kidney Disease.    Anion gap 9 5 - 15    Comment: Performed at [facility], [address] [place], [place] [address]  CBC     Status: Abnormal   Collection Time: 02/13/18 10:31 PM  Result Value Ref Range   WBC 16.0 (H) 3.8 - 10.6 K/uL   RBC 4.16 (L) 4.40 - 5.90 MIL/uL   Hemoglobin 12.6 (L) 13.0 - 18.0 g/dL   HCT 38.6 (L) 40.0 - 52.0 %   MCV 92.9 80.0 -  100.0 fL   MCH 30.4 26.0 - 34.0 pg   MCHC 32.7 32.0 - 36.0 g/dL   RDW 15.0 (H) 11.5 - 14.5 %   Platelets 265 150 - 440 K/uL    Comment: Performed at [facility], [address] [place], [place] [address]  Troponin I     Status: None   Collection Time: 02/13/18 10:31 PM  Result Value Ref Range   Troponin I <0.03 <0.03 ng/mL    Comment: Performed at [facility], [address] [place], [place] [address]  Urinalysis, Complete w Microscopic     Status: Abnormal   Collection Time: 02/14/18  1:12 AM  Result Value Ref Range   Color, Urine YELLOW (A) YELLOW   APPearance CLOUDY (A) CLEAR   Specific Gravity, Urine 1.013 1.005 - 1.030   pH 7.0 5.0 - 8.0   Glucose, UA NEGATIVE NEGATIVE mg/dL   Hgb urine dipstick NEGATIVE NEGATIVE   Bilirubin Urine NEGATIVE NEGATIVE   Ketones, ur NEGATIVE NEGATIVE mg/dL   Protein, ur NEGATIVE NEGATIVE mg/dL   Nitrite NEGATIVE NEGATIVE   Leukocytes, UA NEGATIVE NEGATIVE   RBC / HPF 0-5 0 - 5 RBC/hpf   WBC, UA 6-10 0 - 5 WBC/hpf   Bacteria, UA NONE SEEN NONE SEEN   Squamous Epithelial / LPF 0-5 0 - 5   Mucus PRESENT     Comment: Performed at [facility], [address] [place], [place] [address]   Dg Chest 2 View  Result Date: 02/13/2018 CLINICAL DATA:  72 y/o M; 4 days of shortness of breath. History of COPD and smoking. EXAM: CHEST - 2 VIEW COMPARISON:  07/04/2015 chest radiograph FINDINGS: Stable scarring in the right lung apex. Emphysema with hyperinflated lungs. No consolidation, effusion, or pneumothorax. Stable normal cardiac silhouette given projection and technique. No acute osseous abnormality is evident. 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 No acute pulmonary process identified. Electronically Signed   By: Kristine Garbe M.D.   On: 02/13/2018 22:59    Review of Systems  Constitutional: Negative for chills and fever.  HENT: Negative for sore throat and tinnitus.   Eyes: Negative  for blurred vision and redness.  Respiratory: Positive for cough and shortness of breath. Negative for sputum production.   Cardiovascular: Negative for chest pain, palpitations, orthopnea and PND.  Gastrointestinal: Negative for abdominal pain, diarrhea, nausea and vomiting.  Genitourinary: Negative for dysuria, frequency and urgency.  Musculoskeletal: Negative for joint pain and myalgias.  Skin: Negative for rash.       No lesions  Neurological: Negative for speech change, focal weakness and weakness.  Endo/Heme/Allergies: Does not bruise/bleed easily.       No temperature intolerance  Psychiatric/Behavioral: Negative for depression and suicidal ideas.    Blood pressure (!) 135/98, pulse 88, temperature 98.1 F (36.7 C), temperature source Oral, resp. rate 14, height 5' 6"  (1.676 m), weight 44.5 kg (98 lb), SpO2 100 %. Physical Exam  Vitals reviewed. Constitutional: He is oriented to person, place, and time. He appears well-developed and well-nourished. No distress.  HENT:  Head: Normocephalic and atraumatic.  Mouth/Throat: Oropharynx is clear and moist.  Eyes: Pupils are equal, round, and reactive to light. Conjunctivae and EOM are normal. Left eye exhibits discharge. No scleral icterus.  Neck: Normal range of motion. Neck supple. No JVD present. No tracheal deviation present. No thyromegaly present.  Cardiovascular: Normal rate, regular rhythm and normal heart sounds. Exam reveals no gallop and no friction rub.  No murmur heard. Respiratory: Breath sounds normal. He is in respiratory distress.  Moderate to poor air movement  GI: Soft. Bowel sounds are normal. He exhibits no distension. There is no tenderness.  Genitourinary:  Genitourinary Comments: Deferred  Musculoskeletal: Normal range of motion. He exhibits no edema.  Lymphadenopathy:    He has no cervical adenopathy.  Neurological: He is alert and oriented to person, place, and time. No cranial nerve deficit.  Skin: Skin  is warm and dry. No rash noted. No erythema.  Psychiatric: He has a normal mood and affect. His behavior is normal. Judgment and thought content normal.     Assessment/Plan This is a 72 year old male admitted for respiratory distress. 1.  Respiratory distress: Secondary to COPD.  Oxygen saturations are nearly 100% at rest on baseline 2 L of oxygen via nasal cannula.  However the patient is auto-PEEPing and desats to less than 88% when ambulating despite supplemental oxygen.  He refuses to wear BiPAP.  Consider high flow nasal cannula.  Albuterol as needed.  Continue Breo.  Steroid taper. 2.  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  Supplement caloric intake if possible. 3.  Depression: Continue fluoxetine 4.  DVT prophylaxis: Lovenox 5.  GI prophylaxis: None The patient is a full code.  Time spent on admission orders and patient care approximately 45 minutes  Harrie Foreman, MD 02/14/2018, 2:16 AM

## 2018-02-14 NOTE — Progress Notes (Signed)
Initial Nutrition Assessment  DOCUMENTATION CODES:   Severe malnutrition in context of chronic illness  INTERVENTION:   Ensure Enlive po TID, each supplement provides 350 kcal and 20 grams of protein  Magic cup TID with meals, each supplement provides 290 kcal and 9 grams of protein  MVI daily  Dysphagia 3 diet   NUTRITION DIAGNOSIS:   Severe Malnutrition related to chronic illness(COPD, esophageal stricture ) as evidenced by severe fat depletion, severe muscle depletion.  GOAL:   Patient will meet greater than or equal to 90% of their needs  MONITOR:   PO intake, Supplement acceptance, Labs, Weight trends, Skin, I & O's  REASON FOR ASSESSMENT:   Malnutrition Screening Tool    ASSESSMENT:   72 y/o male with h/o COPD and esophageal stricture admitted with COPD exacerbation    Met with pt in room today. Pt reports good appetite and oral intake pta. Pt reports good appetite today; pt eating 100% of meals. Pt does like chocolate or strawberry Ensure. Pt reports his weight is stable. RD will order supplements and MVI to help pt meet his estimated needs. Pt with h/o esophageal stricture and requires soft foods; RD will change pt to dysphagia 3 diet.   Medications reviewed and include: colace, lovenox, solu-medrol  Labs reviewed:   NUTRITION - FOCUSED PHYSICAL EXAM:    Most Recent Value  Orbital Region  Moderate depletion  Upper Arm Region  Severe depletion  Thoracic and Lumbar Region  Severe depletion  Buccal Region  Severe depletion  Temple Region  Severe depletion  Clavicle Bone Region  Severe depletion  Clavicle and Acromion Bone Region  Severe depletion  Scapular Bone Region  Severe depletion  Dorsal Hand  Severe depletion  Patellar Region  Severe depletion  Anterior Thigh Region  Severe depletion  Posterior Calf Region  Severe depletion  Edema (RD Assessment)  None  Hair  Reviewed  Eyes  Reviewed  Mouth  Reviewed  Skin  Reviewed  Nails  Reviewed      Diet Order:   Diet Order           DIET DYS 3 Room service appropriate? Yes; Fluid consistency: Thin  Diet effective now         EDUCATION NEEDS:   Education needs have been addressed  Skin:  Skin Assessment: Reviewed RN Assessment  Last BM:  7/11  Height:   Ht Readings from Last 1 Encounters:  02/13/18 5' 6"  (1.676 m)    Weight:   Wt Readings from Last 1 Encounters:  02/14/18 88 lb 8 oz (40.1 kg)    Ideal Body Weight:  64.5 kg  BMI:  Body mass index is 14.28 kg/m.  Estimated Nutritional Needs:   Kcal:  1500-1700kcal/day   Protein:  64-72g/day  Fluid:  >1L/day   Koleen Distance MS, RD, LDN Pager #- [phone] Office#- [phone] After Hours Pager: [phone]

## 2018-02-14 NOTE — Consult Note (Addendum)
Consultation Note Date: 02/14/2018   Patient Name: [place] Robinson  DOB: 01-28-1946  MRN: [id number]  Age / Sex: 72 y.o., male  PCP: Cyndi Bender, PA-C Referring Physician: Loletha Grayer, MD  Reason for Consultation: Establishing goals of care  HPI/Patient Profile: The patient with past medical history of COPD and cardiac arrest presents the emergency department complaining of shortness of breath.    Clinical Assessment and Goals of Care: Patient is resting in bed. His wife and son are at bedside. He and his wife have been married for 30 years. They have 1 son together and he has 3 children with a previous wife. He is a retired Arts administrator.   Mr. Kleckner had a cardiac arrest in 2010, and his wife states she was told he has months to live. She states she had to make a decision about extubation, and he rallied and is still here. He has had COPD for years and has used O2 since 2011. She states they had investigated lung transplant a few years ago, but he was not a candidate.   She states she has noticed a decline in her husband since a COPD exacerbation in June 2019. She states he has declined. He has been followed by a pulmonologist and has had steroids to help his breathing. She states "the little grapes in his lungs blew up."   We discussed his quality of life at home. There are days he only gets out of bed to go to the bathroom. His wife states that sometimes this is 3-5 days at a time. Other times he stays on the couch for days at a time. His wife states there are days he does not want to take his medications. She states he was recently placed on a medication to help with depression and his appetite (Prozac). She states his appetite has been waxing and waning, but since starting the medication he has been eating more. He eats more on his high doses of steroids as well. He weighs 88 pounds currently.      We discussed his diagnosis, prognosis, GOC, EOL wishes disposition and options.  A detailed discussion was had today regarding advanced directives.  Concepts specific to code status, artifical feeding and hydration, continued IV antibiotics and rehospitalization were discussed.  The difference between an aggressive medical intervention path and a hospice comfort care path was discussed.  Values and goals of care important to patient and family were attempted to be elicited.  He states initially he is at a place of wanting to focus on comfort. He states she will not let him stay at home if he only wanted comfort care. His wife explains she pushes him to do more and and advises her husband she would not turn her home into a nursing home for him. She states this was to motivate him, as when she tells him this, he tries to eat more and participate.   He then states he is willing to try this one more time. He states he  is willing to return to the hospital once more before he is no longer willing to be hospitalized with a focus on comfort. He states the end is coming no matter what you do.   He states he would never want a feeding tube or dialysis. He would not want to be placed on a ventilator for respiratory distress. He would not want CPR or to have a breathing tube placed for cardiac or pulmonary arrest.   MOST form completed for DNR, limited additional interventions, abx and IV fluid okay, no feeding tube.      SUMMARY OF RECOMMENDATIONS    Recommend palliative on D/C. He states he would want to try this one more time, and see how things go following this hospitalization.   Code Status/Advance Care Planning:  DNR    Symptom Management:   Per primary team.   Palliative Prophylaxis:   Eye Care and Oral Care    Prognosis:   < 6 months  Discharge Planning: To Be Determined      Primary Diagnoses: Present on Admission: . COPD with respiratory distress, acute ([place])   I  have reviewed the medical record, interviewed the patient and family, and examined the patient. The following aspects are pertinent.  Past Medical History:  Diagnosis Date  . COPD (chronic obstructive pulmonary disease) ([place])   . Depression   . GERD (gastroesophageal reflux disease)   . Osteoporosis   . Oxygen deficiency    Social History   Socioeconomic History  . Marital status: Married    Spouse name: Not on file  . Number of children: Not on file  . Years of education: Not on file  . Highest education level: Not on file  Occupational History  . Occupation: Retired- Programme researcher, broadcasting/film/video  . Financial resource strain: Not on file  . Food insecurity:    Worry: Not on file    Inability: Not on file  . Transportation needs:    Medical: Not on file    Non-medical: Not on file  Tobacco Use  . Smoking status: Former Smoker    Packs/day: 1.00    Years: 20.00    Pack years: 20.00    Types: Cigarettes    Last attempt to quit: 07/07/2007    Years since quitting: 10.6  . Smokeless tobacco: Never Used  Substance and Sexual Activity  . Alcohol use: No    Alcohol/week: 0.0 oz  . Drug use: No  . Sexual activity: Not on file  Lifestyle  . Physical activity:    Days per week: Not on file    Minutes per session: Not on file  . Stress: Not on file  Relationships  . Social connections:    Talks on phone: Not on file    Gets together: Not on file    Attends religious service: Not on file    Active member of club or organization: Not on file    Attends meetings of clubs or organizations: Not on file    Relationship status: Not on file  Other Topics Concern  . Not on file  Social History Narrative  . Not on file   Family History  Problem Relation Age of Onset  . Allergies Sister   . Asthma Sister   . Asthma Son   . Cancer Sister        ? type   Scheduled Meds: . budesonide (PULMICORT) nebulizer solution  0.5 mg Nebulization BID  . docusate sodium  100 mg Oral  BID  .  enoxaparin (LOVENOX) injection  40 mg Subcutaneous Q24H  . feeding supplement (ENSURE ENLIVE)  237 mL Oral TID BM  . ipratropium-albuterol  3 mL Nebulization Q6H  . mouth rinse  15 mL Mouth Rinse BID  . methylPREDNISolone (SOLU-MEDROL) injection  40 mg Intravenous Daily  . multivitamin with minerals  1 tablet Oral Daily   Continuous Infusions: PRN Meds:.acetaminophen **OR** acetaminophen, albuterol, morphine CONCENTRATE, ondansetron **OR** ondansetron (ZOFRAN) IV Medications Prior to Admission:  Prior to Admission medications   Medication Sig Start Date End Date Taking? Authorizing Provider  albuterol (VENTOLIN HFA) 108 (90 BASE) MCG/ACT inhaler Inhale 2 puffs into the lungs every 6 (six) hours as needed for wheezing or shortness of breath.   Yes [provider]  aspirin 81 MG tablet Take 81 mg by mouth daily.   Yes [provider]  FLUoxetine (PROZAC) 20 MG capsule Take 40 mg by mouth daily. 01/31/18  Yes [provider]  Lactobacillus Rhamnosus, GG, (CULTURELLE PO) Take 1 capsule by mouth 2 (two) times daily.   Yes [provider]  pantoprazole (PROTONIX) 40 MG tablet Take 40 mg by mouth daily.   Yes [provider]  predniSONE (DELTASONE) 10 MG tablet Take 20 mg by mouth daily. 01/20/18  Yes [provider]  senna (SENOKOT) 8.6 MG tablet Take 2 tablets by mouth daily.   Yes [provider]   No Known Allergies Review of Systems  Respiratory: Positive for shortness of breath.   Neurological: Positive for weakness.    Physical Exam  Constitutional: No distress.  Pulmonary/Chest: Effort normal.  O2 in place.   Neurological: He is alert.  Oriented  Skin: Skin is warm and dry.    Vital Signs: BP 130/89 (BP Location: Left Arm)   Pulse (!) 102   Temp 98.1 F (36.7 C) (Oral)   Resp 19   Ht 5\' 6"  (1.676 m)   Wt 40.1 kg (88 lb 8 oz)   SpO2 98%   BMI 14.28 kg/m  Pain Scale: 0-10   Pain Score: 0-No pain   SpO2:  SpO2: 98 % O2 Device:SpO2: 98 % O2 Flow Rate: .O2 Flow Rate (L/min): 3 L/min  IO: Intake/output summary:   Intake/Output Summary (Last 24 hours) at 02/14/2018 1555 Last data filed at 02/14/2018 0959 Gross per 24 hour  Intake 120 ml  Output 0 ml  Net 120 ml    LBM: Last BM Date: 02/13/18 Baseline Weight: Weight: 44.5 kg (98 lb) Most recent weight: Weight: 40.1 kg (88 lb 8 oz)     Palliative Assessment/Data: 30%     Time In: 3:10 Time Out: 4:20 Time Total: 70 min Greater than 50%  of this time was spent counseling and coordinating care related to the above assessment and plan.  Signed by: Asencion Gowda, NP   Please contact Palliative Medicine Team phone at [phone] for questions and concerns.  For individual provider: See Shea Evans

## 2018-02-14 NOTE — Progress Notes (Signed)
Patient ID: [place], male   DOB: March 13, 1946, 72 y.o.   MRN: [id number]  ACP note  Patient, wife and family present.  Diagnosis: End-stage COPD, COPD exacerbation, severe malnutrition, weight loss, depression.  CODE STATUS discussed.  Patient changed to a DO NOT RESUSCITATE.  Patient then asked for his wife and family to leave.  He asked me if he should even come back to the hospital anymore.  I offered to start some Roxanol which may help out with his air hunger and make him symptomatically feel better where he can stay at home.  I put in for palliative care consultation because he will be a candidate for palliative or even hospice to follow at his home.  Time spent on ACP discussion 28 minutes  Dr. Loletha Grayer.

## 2018-02-14 NOTE — Care Management (Signed)
Patient placed in observation from home for exac of copd.  Patient is on chronic home 02 and portable tank is in the room for transport home at discharge.  Lives with wife.  Palliative is consulting for goals of care. No discharge concerns identified at present

## 2018-02-15 DIAGNOSIS — F329 Major depressive disorder, single episode, unspecified: Secondary | ICD-10-CM | POA: Diagnosis not present

## 2018-02-15 DIAGNOSIS — J441 Chronic obstructive pulmonary disease with (acute) exacerbation: Secondary | ICD-10-CM | POA: Diagnosis not present

## 2018-02-15 DIAGNOSIS — J449 Chronic obstructive pulmonary disease, unspecified: Secondary | ICD-10-CM | POA: Diagnosis not present

## 2018-02-15 DIAGNOSIS — E43 Unspecified severe protein-calorie malnutrition: Secondary | ICD-10-CM | POA: Diagnosis not present

## 2018-02-15 MED ORDER — ENSURE ENLIVE PO LIQD: 237.0000 mL | Freq: Three times a day (TID) | ORAL | 12 refills | Status: AC

## 2018-02-15 MED ORDER — PANTOPRAZOLE SODIUM 40 MG PO TBEC
40.0000 mg | DELAYED_RELEASE_TABLET | Freq: Every day | ORAL | Status: DC
  Administered 2018-02-15: 40 mg via ORAL
  Filled 2018-02-15: qty 1

## 2018-02-15 MED ORDER — ADULT MULTIVITAMIN W/MINERALS CH: 1.0000 | ORAL_TABLET | Freq: Every day | ORAL | 0 refills | Status: AC

## 2018-02-15 MED ORDER — MORPHINE SULFATE (CONCENTRATE) 10 MG/0.5ML PO SOLN: 5.0000 mg | Freq: Four times a day (QID) | ORAL | 0 refills | Status: AC | PRN

## 2018-02-15 MED ORDER — IPRATROPIUM-ALBUTEROL 0.5-2.5 (3) MG/3ML IN SOLN: 3.0000 mL | Freq: Four times a day (QID) | RESPIRATORY_TRACT | 1 refills | Status: AC

## 2018-02-15 NOTE — Care Management (Signed)
Patient to be discharged per MD order. Orders in place for outpatient palliative care. I have spoke with the patient who is in agreement and would like to start with [facility]. Referral paper work faxed. Orders in for DME nebulizer but per patient he already has one that functions in the home. Family to provide transport and bring portable oxygen tank. No further RNCM needs. Ines Bloomer RN BSN RNCM [phone]

## 2018-02-15 NOTE — Discharge Summary (Signed)
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 COPD (chronic obstructive pulmonary disease) ([place])   . Depression   . GERD (gastroesophageal reflux disease)   . Osteoporosis   . Oxygen deficiency     HOSPITAL COURSE:   72 year old male admitted for respiratory distress.  1.  Acute on Chronic Respiratory distress: Secondary to End stage COPD.  Oxygen saturations are nearly 100% at rest on baseline 2 L of oxygen via nasal cannula.   -  He refused to wear BiPAP in ER.    Albuterol as needed.  Continue Breo.  Steroid taper -pt is no chronic po steroid daily. -currently not wheezing. Appears at baseline -weak  -pt was seen by Palliative care. Pt is DNR -will prescribe small dose of roxanol for air hunger and anxiety at home--pt is agreeable with it. He says whatever helps him to stay out of the hospital/ He understands he is declining and has overall poor prognosis Palliative care to follow at home  2.  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  Supplement caloric intake if possible. -Ensure drink and MVI -seen by dietitian  3.  Depression: Continue fluoxetine  4.  DVT prophylaxis: Lovenox  Pt is agreeable to go home later with [facility] and palliative care services  CONSULTS OBTAINED:    DRUG ALLERGIES:  No Known Allergies  DISCHARGE MEDICATIONS:   Allergies as of 02/15/2018   No Known Allergies     Medication List    TAKE these  medications   aspirin 81 MG tablet Take 81 mg by mouth daily.   CULTURELLE PO Take 1 capsule by mouth 2 (two) times daily.   feeding supplement (ENSURE ENLIVE) Liqd Take 237 mLs by mouth 3 (three) times daily between meals.   FLUoxetine 20 MG capsule Commonly known as:  PROZAC Take 40 mg by mouth daily.   ipratropium-albuterol 0.5-2.5 (3) MG/3ML Soln Commonly known as:  DUONEB Take 3 mLs by nebulization every 6 (six) hours.   morphine CONCENTRATE 10 MG/0.5ML Soln concentrated solution Take 0.25 mLs (5 mg total) by mouth every 6 (six) hours as needed for shortness of breath.   multivitamin with minerals Tabs tablet Take 1 tablet by mouth daily. Start taking on:  02/16/2018   pantoprazole 40 MG tablet Commonly known as:  PROTONIX Take 40 mg by mouth daily.   predniSONE 10 MG tablet Commonly known as:  DELTASONE Take 20 mg by mouth daily.   senna 8.6 MG tablet Commonly known as:  SENOKOT Take 2 tablets by mouth daily.   VENTOLIN HFA 108 (90 Base) MCG/ACT inhaler Generic drug:  albuterol Inhale 2 puffs into the lungs every 6 (six) hours as needed for wheezing or shortness of breath.            Durable Medical Equipment  (From admission, onward)        Start     Ordered   02/15/18 1300  For home use only DME Nebulizer machine  Once    Question:  Patient needs a nebulizer to treat with the following condition  Answer:  COPD with emphysema ([place])   02/15/18 1259      If you experience worsening of your admission symptoms, develop shortness of breath, life threatening emergency, suicidal or homicidal thoughts you must seek medical attention immediately by calling 911 or calling your MD immediately  if symptoms less severe.  You Must read complete instructions/literature along with all the possible adverse reactions/side effects for all the Medicines you take and that have been prescribed to you. Take any new Medicines after you have completely understood and accept  all the possible adverse reactions/side effects.   Please note  You were cared for by a hospitalist during your hospital stay. If you have any questions about your discharge medications or the care you received while you were in the hospital after you are discharged, you can call the unit and asked to speak with the hospitalist on call if the hospitalist that took care of you is not available. Once you are discharged, your primary care physician will handle any further medical issues. Please note that NO REFILLS for any discharge medications will be authorized once you are discharged, as it is imperative that you return to your primary care physician (or establish a relationship with a primary care physician if you do not have one) for your aftercare needs so that they can reassess your need for medications and monitor your lab values. Today   SUBJECTIVE  Doing about the same. Breathing comfortably Says he is tired   VITAL SIGNS:  Blood pressure (!) 136/98, pulse 68, temperature 97.7 F (36.5 C), temperature source Oral, resp. rate 18, height 5\' 6"  (1.676 m), weight 40.9 kg (90 lb 3.2 oz), SpO2 100 %.  I/O:    Intake/Output Summary (Last 24 hours) at 02/15/2018 1303 Last data filed at 02/15/2018 0959 Gross per 24 hour  Intake 240 ml  Output 320 ml  Net -80 ml    PHYSICAL EXAMINATION:  GENERAL:  72 y.o.-year-old patient lying in the bed with no acute distress. Thin,cachectic EYES: Pupils equal, round, reactive to light and accommodation. No scleral icterus. Extraocular muscles intact.  HEENT: Head atraumatic, normocephalic. Oropharynx and nasopharynx clear.  NECK:  Supple, no jugular venous distention. No thyroid enlargement, no tenderness.  LUNGS: distant breath sounds bilaterally, no wheezing, rales,rhonchi or crepitation. No use of accessory muscles of respiration.  CARDIOVASCULAR: S1, S2 normal. No murmurs, rubs, or gallops.  ABDOMEN: Soft, non-tender, non-distended. Bowel sounds  present. No organomegaly or mass.  EXTREMITIES: No pedal edema, cyanosis, or clubbing.  NEUROLOGIC: Cranial nerves II through XII are intact. Muscle strength 5/5 in all extremities. Sensation intact. Gait not checked.  PSYCHIATRIC: The patient is alert and oriented x 3.  SKIN: No obvious rash, lesion, or ulcer.   DATA REVIEW:   CBC  Recent Labs  Lab 02/13/18 2231  WBC 16.0*  HGB 12.6*  HCT 38.6*  PLT 265    Chemistries  Recent Labs  Lab 02/13/18 2231  NA 145  K 3.7  CL 100  CO2 36*  GLUCOSE 124*  BUN 15  CREATININE 0.66  CALCIUM 9.3    Microbiology Results   No results found for this or any previous visit (from the past 240 hour(s)).  RADIOLOGY:  Dg Chest 2 View  Result Date: 02/13/2018 CLINICAL DATA:  72 y/o M; 4 days of shortness of breath. History of COPD and smoking. EXAM: CHEST - 2  VIEW COMPARISON:  07/04/2015 chest radiograph FINDINGS: Stable scarring in the right lung apex. Emphysema with hyperinflated lungs. No consolidation, effusion, or pneumothorax. Stable normal cardiac silhouette given projection and technique. No acute osseous abnormality is evident. 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 No acute pulmonary process identified. Electronically Signed   By: Kristine Garbe M.D.   On: 02/13/2018 22:59     Management plans discussed with the patient, family and they are in agreement.  CODE STATUS:     Code Status Orders  (From admission, onward)        Start     Ordered   02/14/18 1332  Do not attempt resuscitation (DNR)  Continuous    Question Answer Comment  In the event of cardiac or respiratory ARREST Do not call a "code blue"   In the event of cardiac or respiratory ARREST Do not perform Intubation, CPR, defibrillation or ACLS   In the event of cardiac or respiratory ARREST Use medication by any route, position, wound care, and other measures to relive pain and suffering. May use oxygen, suction and manual treatment  of airway obstruction as needed for comfort.   Comments nurse may pronounce      02/14/18 1332    Code Status History    Date Active Date Inactive Code Status Order ID Comments User Context   02/14/2018 0207 02/14/2018 1332 Full Code [id number]  Harrie Foreman, MD Inpatient    Advance Directive Documentation     Most Recent Value  Type of Advance Directive  Healthcare Power of [place], Living will  Pre-existing out of facility DNR order (yellow form or pink MOST form)  -  "MOST" Form in Place?  -      TOTAL TIME TAKING CARE OF THIS PATIENT: *40* minutes.    Fritzi Mandes M.D on 02/15/2018 at 1:03 PM  Between 7am to 6pm - Pager - [phone] After 6pm go to [email or web address] - password [email or web address] [place] [facility]  Office  [phone]  CC: Primary care physician; Cyndi Bender, PA-C

## 2018-02-15 NOTE — Evaluation (Signed)
Physical Therapy Evaluation Patient Details Name: Edgar Robinson MRN: [id number] DOB: [birth year] Today's Date: 02/15/2018   History of Present Illness  Pt is a pleasant 72y/o male presenting to ED with SOB with hx of COPD and cardiac arrest. PMH also significant for the following: osteoporosis and depression. Per pt chart, COPD has become increasingly worse over the last few months.  Clinical Impression  Pt willing to work with PT today, as he reported he wants options. Pt denied falls prior to hospitalization for SOB. Pt reported he sometimes stays in bed for long periods of time and only amb. To bathroom. Pt stated he does not use any AD for amb. And was IND in ADLs. Need to verify this with pt's family, who was not present. Pt has a palliative care consult. Pt limited today by SOB, elevated HR at rest and with activity (100s-120s). SpO2 (2L) was 99% at rest with decr. To 93% after amb. Pt required min A to S during bed mobility and min A during STS txfs with RW. Pt unable to amb. 2/2 fatigue, tremors, and wooziness upon sitting upright and standing. PT discussed d/c home vs. SNF and pt stated he'd like the option for both. Pt would benefit from skilled PT to address above deficits and promote optimal return to PLOF.    Follow Up Recommendations SNF(or palliative care based on pt's request)    Equipment Recommendations  Rolling walker with 5" wheels(if pt goes home)    Recommendations for Other Services       Precautions / Restrictions Precautions Precautions: Fall Restrictions Weight Bearing Restrictions: No      Mobility  Bed Mobility Overal bed mobility: Needs Assistance Bed Mobility: Sit to Supine;Supine to Sit     Supine to sit: Min assist Sit to supine: Supervision   General bed mobility comments: Min A to assist trunk during supine to sit with HOB elevated. S during sit to supine to ensure safety, with HOB elevated. Pt required incr. time during all activities 2/2 SOB,  fatigue and wooziness.   Transfers Overall transfer level: Needs assistance Equipment used: Rolling walker (2 wheeled) Transfers: Sit to/from Stand Sit to Stand: Min assist         General transfer comment: Min A during STS txfs with RW. Cues and demo for hand placement, scoot forward towards EOB, and to improve anterior weight shifting during sit to stand. Pt able to stand for approx. 1 minute before requesting to sit back down 2/2 fatigue, wooziness, and SOB. Pt noted to experience whole body tremors during any activity.   Ambulation/Gait Ambulation/Gait assistance: (not attempted 2/2 decr. safety upon standing (tremors))              Stairs            Wheelchair Mobility    Modified Rankin (Stroke Patients Only)       Balance Overall balance assessment: Needs assistance Sitting-balance support: No upper extremity supported;Feet supported Sitting balance-Leahy Scale: Good     Standing balance support: Bilateral upper extremity supported Standing balance-Leahy Scale: Poor Standing balance comment: with UE support on RW and min A to maintain balance.                              Pertinent Vitals/Pain Pain Assessment: No/denies pain    Home Living Family/patient expects to be discharged to:: Private residence Living Arrangements: Spouse/significant other Available Help at Discharge: Family;Available 24 hours/day  Type of Home: House Home Access: Stairs to enter Entrance Stairs-Rails: Left(going up) Entrance Stairs-Number of Steps: 6 Home Layout: One level Home Equipment: None(need to verify with family) Additional Comments: Pt on SpO2, 2LO2 at all times    Prior Function Level of Independence: Independent         Comments: Again, need to verify with family, as pt if very frail.     Hand Dominance        Extremity/Trunk Assessment   Upper Extremity Assessment Upper Extremity Assessment: Generalized weakness    Lower Extremity  Assessment Lower Extremity Assessment: Generalized weakness(with pt reporting N/T in B feet)       Communication   Communication: No difficulties  Cognition Arousal/Alertness: Awake/alert Behavior During Therapy: WFL for tasks assessed/performed Overall Cognitive Status: Within Functional Limits for tasks assessed                                        General Comments      Exercises     Assessment/Plan    PT Assessment Patient needs continued PT services  PT Problem List Decreased strength;Decreased range of motion;Decreased activity tolerance;Decreased balance;Decreased mobility;Decreased knowledge of use of DME;Impaired sensation       PT Treatment Interventions DME instruction;Gait training;Therapeutic activities;Functional mobility training;Stair training;Therapeutic exercise;Balance training;Neuromuscular re-education;Patient/family education    PT Goals (Current goals can be found in the Care Plan section)  Acute Rehab PT Goals Patient Stated Goal: To have options. PT Goal Formulation: With patient Time For Goal Achievement: 03/01/18 Potential to Achieve Goals: Fair    Frequency Min 2X/week   Barriers to discharge   Need to verify assistance available at home, and DME.    Co-evaluation               AM-PAC PT "6 Clicks" Daily Activity  Outcome Measure Difficulty turning over in bed (including adjusting bedclothes, sheets and blankets)?: A Little Difficulty moving from lying on back to sitting on the side of the bed? : A Little Difficulty sitting down on and standing up from a chair with arms (e.g., wheelchair, bedside commode, etc,.)?: A Little Help needed moving to and from a bed to chair (including a wheelchair)?: A Lot Help needed walking in hospital room?: A Lot Help needed climbing 3-5 steps with a railing? : A Lot 6 Click Score: 15    End of Session Equipment Utilized During Treatment: Oxygen Activity Tolerance: Patient limited  by fatigue;Other (comment)(and SOB) Patient left: in bed;with call bell/phone within reach;with bed alarm set   PT Visit Diagnosis: Unsteadiness on feet (R26.81);Other abnormalities of gait and mobility (R26.89);Muscle weakness (generalized) (M62.81)    Time: 9147-8295 PT Time Calculation (min) (ACUTE ONLY): 20 min   Charges:   PT Evaluation $PT Eval Low Complexity: 1 Low PT Treatments $Therapeutic Activity: 8-22 mins   PT G Codes:        Geoffry Paradise, PT,DPT 02/15/18 12:00 PM     Hobert Poplaski L 02/15/2018, 11:56 AM

## 2018-02-15 NOTE — Progress Notes (Signed)
Pts family came to visit / both family members had on very strong perfume/  pt begin to have difficulty breathing/ per pt and RN request visitors asked to leave room/ both agreeable/ 2 breathing treatments and roxanol given/ Respiratory called to room/ 02 sats 100%  2L [place] / pts breathing improved/ pt requesting to stay another night to be monitored after episode/ MD made aware/ No fragrance sign put on pts door/ will continue to monitor.

## 2018-02-16 DIAGNOSIS — F329 Major depressive disorder, single episode, unspecified: Secondary | ICD-10-CM | POA: Diagnosis not present

## 2018-02-16 DIAGNOSIS — Z7401 Bed confinement status: Secondary | ICD-10-CM | POA: Diagnosis not present

## 2018-02-16 DIAGNOSIS — E43 Unspecified severe protein-calorie malnutrition: Secondary | ICD-10-CM | POA: Diagnosis not present

## 2018-02-16 DIAGNOSIS — J449 Chronic obstructive pulmonary disease, unspecified: Secondary | ICD-10-CM | POA: Diagnosis not present

## 2018-02-16 DIAGNOSIS — Z9911 Dependence on respirator [ventilator] status: Secondary | ICD-10-CM | POA: Diagnosis not present

## 2018-02-16 DIAGNOSIS — J441 Chronic obstructive pulmonary disease with (acute) exacerbation: Secondary | ICD-10-CM | POA: Diagnosis not present

## 2018-02-16 NOTE — Progress Notes (Signed)
Patient left by EMS to go home.  RN removed his IV.  His home meds and papers were put in his bag to go home with him.  Phillis Knack, RN

## 2018-02-16 NOTE — Discharge Summary (Signed)
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 COPD (chronic obstructive pulmonary disease) ([place])   . Depression   . GERD (gastroesophageal reflux disease)   . Osteoporosis   . Oxygen deficiency     HOSPITAL COURSE:   72 year old male admitted for respiratory distress.  1.  Acute on Chronic Respiratory distress: Secondary to End stage COPD.  Oxygen saturations are nearly 100% at rest on baseline 2 L of oxygen via nasal cannula.   -  He refused to wear BiPAP in ER.    Albuterol as needed.  Continue Breo.  Steroid taper -pt is no chronic po steroid daily. -currently not wheezing. Appears at baseline -weak  -pt was seen by Palliative care. Pt is DNR -will prescribe small dose of roxanol for air hunger and anxiety at home--pt is agreeable with it. He says whatever helps him to stay out of the hospital/ He understands he is declining and has overall poor prognosis Palliative care to follow at home  2.  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  Supplement caloric intake if possible. -Ensure drink and MVI -seen by dietitian -tolerating soft diet well  3.  Depression: Continue fluoxetine  4.  DVT prophylaxis: Lovenox  Pt is agreeable to go home later with [facility] and palliative care services Left message for wife Nesanel Aguila on the phone  CONSULTS OBTAINED:    DRUG ALLERGIES:  No Known  Allergies  DISCHARGE MEDICATIONS:   Allergies as of 02/16/2018   No Known Allergies     Medication List    TAKE these medications   aspirin 81 MG tablet Take 81 mg by mouth daily.   CULTURELLE PO Take 1 capsule by mouth 2 (two) times daily.   feeding supplement (ENSURE ENLIVE) Liqd Take 237 mLs by mouth 3 (three) times daily between meals.   FLUoxetine 20 MG capsule Commonly known as:  PROZAC Take 40 mg by mouth daily.   ipratropium-albuterol 0.5-2.5 (3) MG/3ML Soln Commonly known as:  DUONEB Take 3 mLs by nebulization every 6 (six) hours.   morphine CONCENTRATE 10 MG/0.5ML Soln concentrated solution Take 0.25 mLs (5 mg total) by mouth every 6 (six) hours as needed for shortness of breath.   multivitamin with minerals Tabs tablet Take 1 tablet by mouth daily.   pantoprazole 40 MG tablet Commonly known as:  PROTONIX Take 40 mg by mouth daily.   predniSONE 10 MG tablet Commonly known as:  DELTASONE Take 20 mg by mouth daily.   senna 8.6 MG tablet Commonly known as:  SENOKOT Take 2 tablets by mouth daily.   VENTOLIN HFA 108 (90 Base) MCG/ACT inhaler Generic drug:  albuterol Inhale 2 puffs into the lungs every 6 (six) hours as needed for wheezing or shortness of breath.            Durable Medical Equipment  (From admission, onward)        Start     Ordered  02/15/18 1300  For home use only DME Nebulizer machine  Once    Question:  Patient needs a nebulizer to treat with the following condition  Answer:  COPD with emphysema ([place])   02/15/18 1259      If you experience worsening of your admission symptoms, develop shortness of breath, life threatening emergency, suicidal or homicidal thoughts you must seek medical attention immediately by calling 911 or calling your MD immediately  if symptoms less severe.  You Must read complete instructions/literature along with all the possible adverse reactions/side effects for all the Medicines you take and that  have been prescribed to you. Take any new Medicines after you have completely understood and accept all the possible adverse reactions/side effects.   Please note  You were cared for by a hospitalist during your hospital stay. If you have any questions about your discharge medications or the care you received while you were in the hospital after you are discharged, you can call the unit and asked to speak with the hospitalist on call if the hospitalist that took care of you is not available. Once you are discharged, your primary care physician will handle any further medical issues. Please note that NO REFILLS for any discharge medications will be authorized once you are discharged, as it is imperative that you return to your primary care physician (or establish a relationship with a primary care physician if you do not have one) for your aftercare needs so that they can reassess your need for medications and monitor your lab values. Today   SUBJECTIVE  Doing about the same. Breathing comfortably Says some family came with strong perfume and got sob and so wanted to stay one more nite   VITAL SIGNS:  Blood pressure (!) 142/93, pulse 97, temperature 97.9 F (36.6 C), temperature source Oral, resp. rate 19, height 5\' 6"  (1.676 m), weight 40.7 kg (89 lb 11.2 oz), SpO2 100 %.  I/O:    Intake/Output Summary (Last 24 hours) at 02/16/2018 0902 Last data filed at 02/15/2018 2100 Gross per 24 hour  Intake -  Output 670 ml  Net -670 ml    PHYSICAL EXAMINATION:  GENERAL:  72 y.o.-year-old patient lying in the bed with no acute distress. Thin,cachectic, malnourished EYES: Pupils equal, round, reactive to light and accommodation. No scleral icterus. Extraocular muscles intact.  HEENT: Head atraumatic, normocephalic. Oropharynx and nasopharynx clear.  NECK:  Supple, no jugular venous distention. No thyroid enlargement, no tenderness.  LUNGS: distant breath sounds bilaterally, no wheezing,  rales,rhonchi or crepitation. No use of accessory muscles of respiration.  CARDIOVASCULAR: S1, S2 normal. No murmurs, rubs, or gallops.  ABDOMEN: Soft, non-tender, non-distended. Bowel sounds present. No organomegaly or mass.  EXTREMITIES: No pedal edema, cyanosis, or clubbing.  NEUROLOGIC: Cranial nerves II through XII are intact. Muscle strength 5/5 in all extremities. Sensation intact. Gait not checked.  PSYCHIATRIC: The patient is alert and oriented x 3.  SKIN: No obvious rash, lesion, or ulcer.   DATA REVIEW:   CBC  Recent Labs  Lab 02/13/18 2231  WBC 16.0*  HGB 12.6*  HCT 38.6*  PLT 265    Chemistries  Recent Labs  Lab 02/13/18 2231  NA 145  K 3.7  CL 100  CO2 36*  GLUCOSE 124*  BUN 15  CREATININE 0.66  CALCIUM 9.3    Microbiology Results   No results found for this or any previous visit (from the past 240 hour(s)).  RADIOLOGY:  No results found.  Management plans discussed with the patient, family and they are in agreement.  CODE STATUS:     Code Status Orders  (From admission, onward)        Start     Ordered   02/14/18 1332  Do not attempt resuscitation (DNR)  Continuous    Question Answer Comment  In the event of cardiac or respiratory ARREST Do not call a "code blue"   In the event of cardiac or respiratory ARREST Do not perform Intubation, CPR, defibrillation or ACLS   In the event of cardiac or respiratory ARREST Use medication by any route, position, wound care, and other measures to relive pain and suffering. May use oxygen, suction and manual treatment of airway obstruction as needed for comfort.   Comments nurse may pronounce      02/14/18 1332    Code Status History    Date Active Date Inactive Code Status Order ID Comments User Context   02/14/2018 0207 02/14/2018 1332 Full Code [id number]  Harrie Foreman, MD Inpatient    Advance Directive Documentation     Most Recent Value  Type of Advance Directive  Healthcare Power of  [place], Living will  Pre-existing out of facility DNR order (yellow form or pink MOST form)  -  "MOST" Form in Place?  -      TOTAL TIME TAKING CARE OF THIS PATIENT: *40* minutes.    Fritzi Mandes M.D on 02/16/2018 at 9:02 AM  Between 7am to 6pm - Pager - [phone] After 6pm go to [email or web address] - password [email or web address] [place] [facility]  Office  [phone]  CC: Primary care physician; Cyndi Bender, PA-C

## 2018-02-18 DIAGNOSIS — R0600 Dyspnea, unspecified: Secondary | ICD-10-CM | POA: Diagnosis not present

## 2018-02-18 DIAGNOSIS — K219 Gastro-esophageal reflux disease without esophagitis: Secondary | ICD-10-CM | POA: Diagnosis not present

## 2018-02-18 DIAGNOSIS — J9610 Chronic respiratory failure, unspecified whether with hypoxia or hypercapnia: Secondary | ICD-10-CM | POA: Diagnosis not present

## 2018-02-18 DIAGNOSIS — F329 Major depressive disorder, single episode, unspecified: Secondary | ICD-10-CM | POA: Diagnosis not present

## 2018-02-18 DIAGNOSIS — Z79899 Other long term (current) drug therapy: Secondary | ICD-10-CM | POA: Diagnosis not present

## 2018-02-18 DIAGNOSIS — R1310 Dysphagia, unspecified: Secondary | ICD-10-CM | POA: Diagnosis not present

## 2018-02-18 DIAGNOSIS — J449 Chronic obstructive pulmonary disease, unspecified: Secondary | ICD-10-CM | POA: Diagnosis not present

## 2018-03-04 DIAGNOSIS — J449 Chronic obstructive pulmonary disease, unspecified: Secondary | ICD-10-CM | POA: Diagnosis not present

## 2018-03-04 DIAGNOSIS — Z515 Encounter for palliative care: Secondary | ICD-10-CM | POA: Diagnosis not present

## 2018-07-06 DEATH — deceased

## 2018-12-03 IMAGING — CR DG CHEST 2V
2 series · 2 of 2 positions shown · non-contrast
Comparison: 07/04/2015 chest radiograph

CLINICAL DATA: 72 y/o M; 4 days of shortness of breath. History of
COPD and smoking.

EXAM:
CHEST - 2 VIEW

[chest lat]
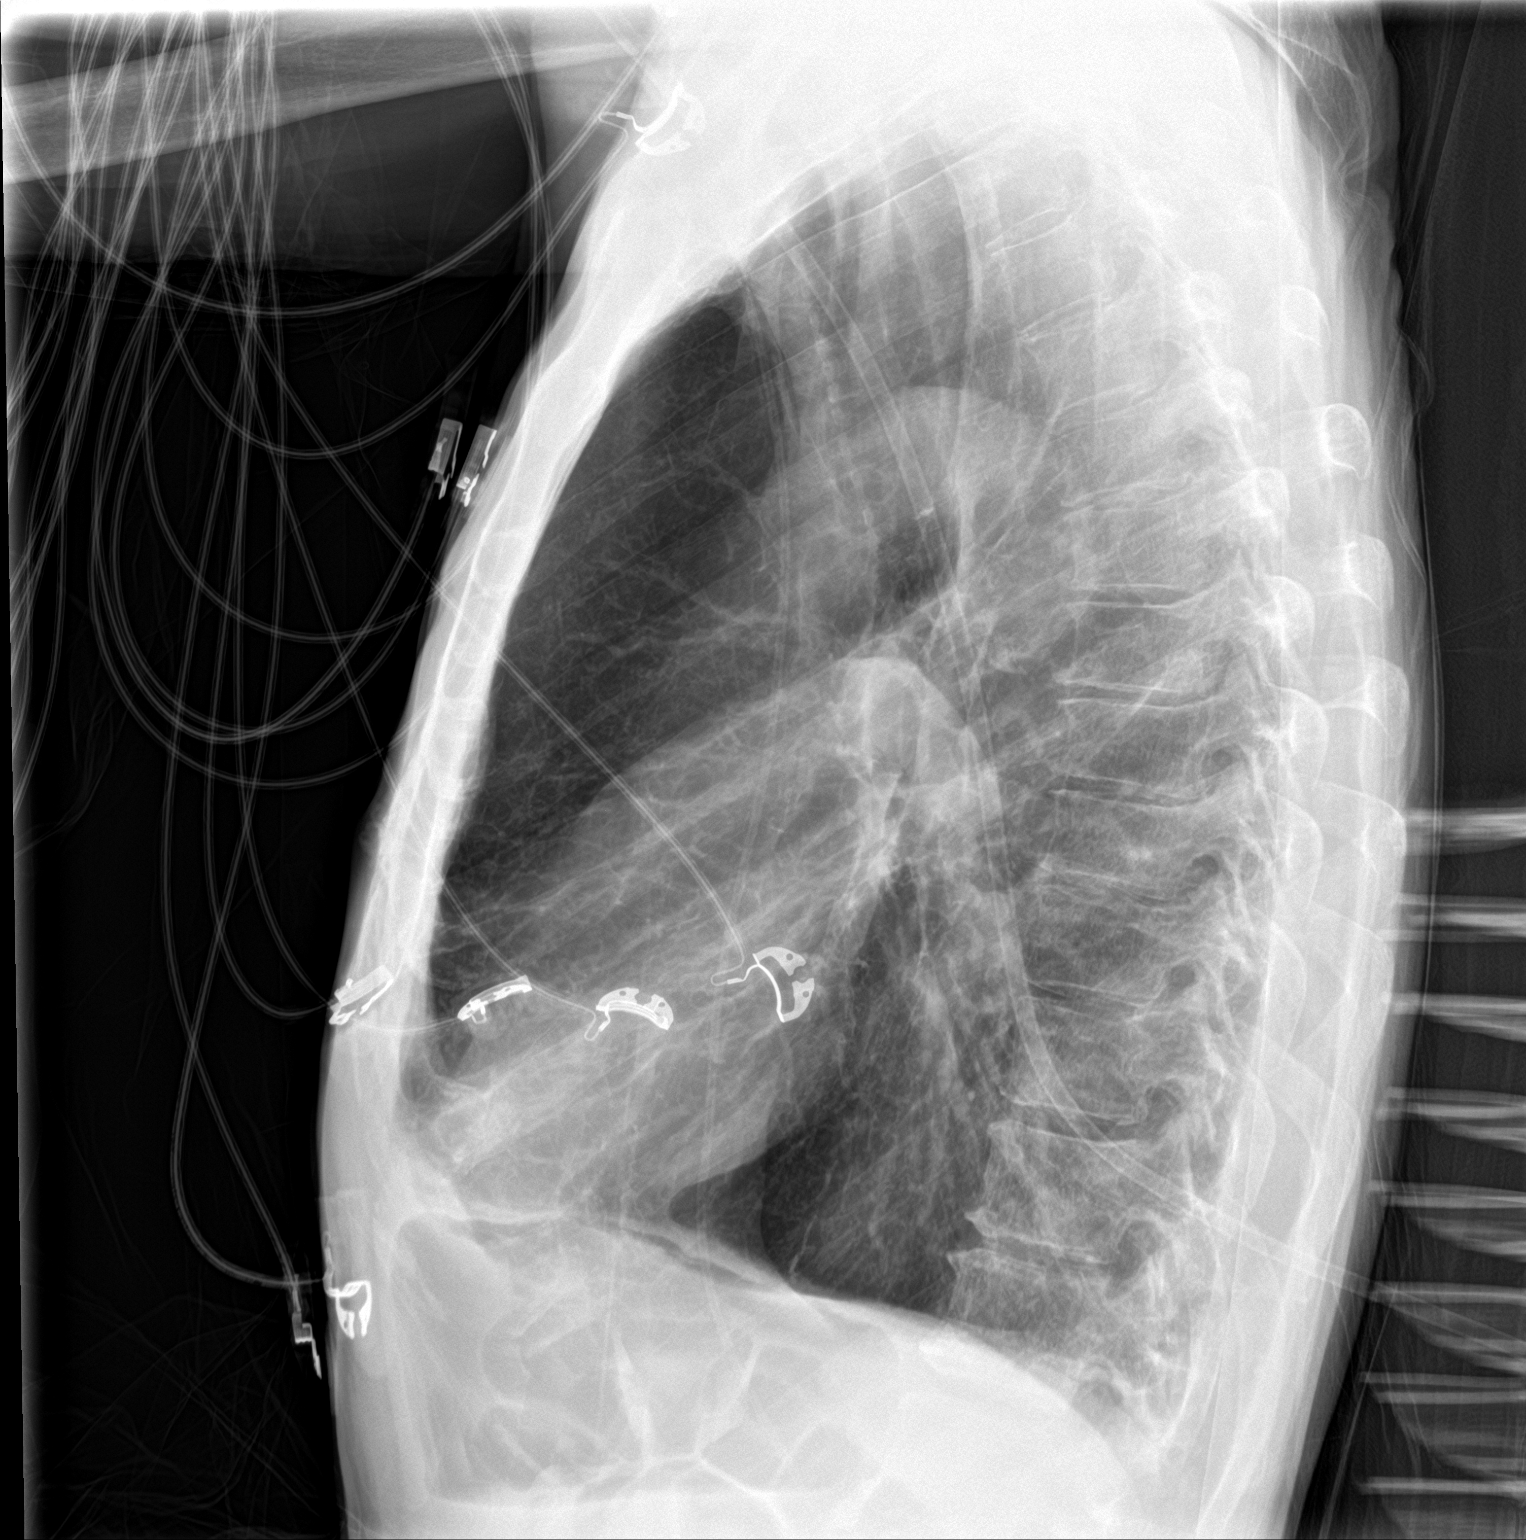

[chest ap]
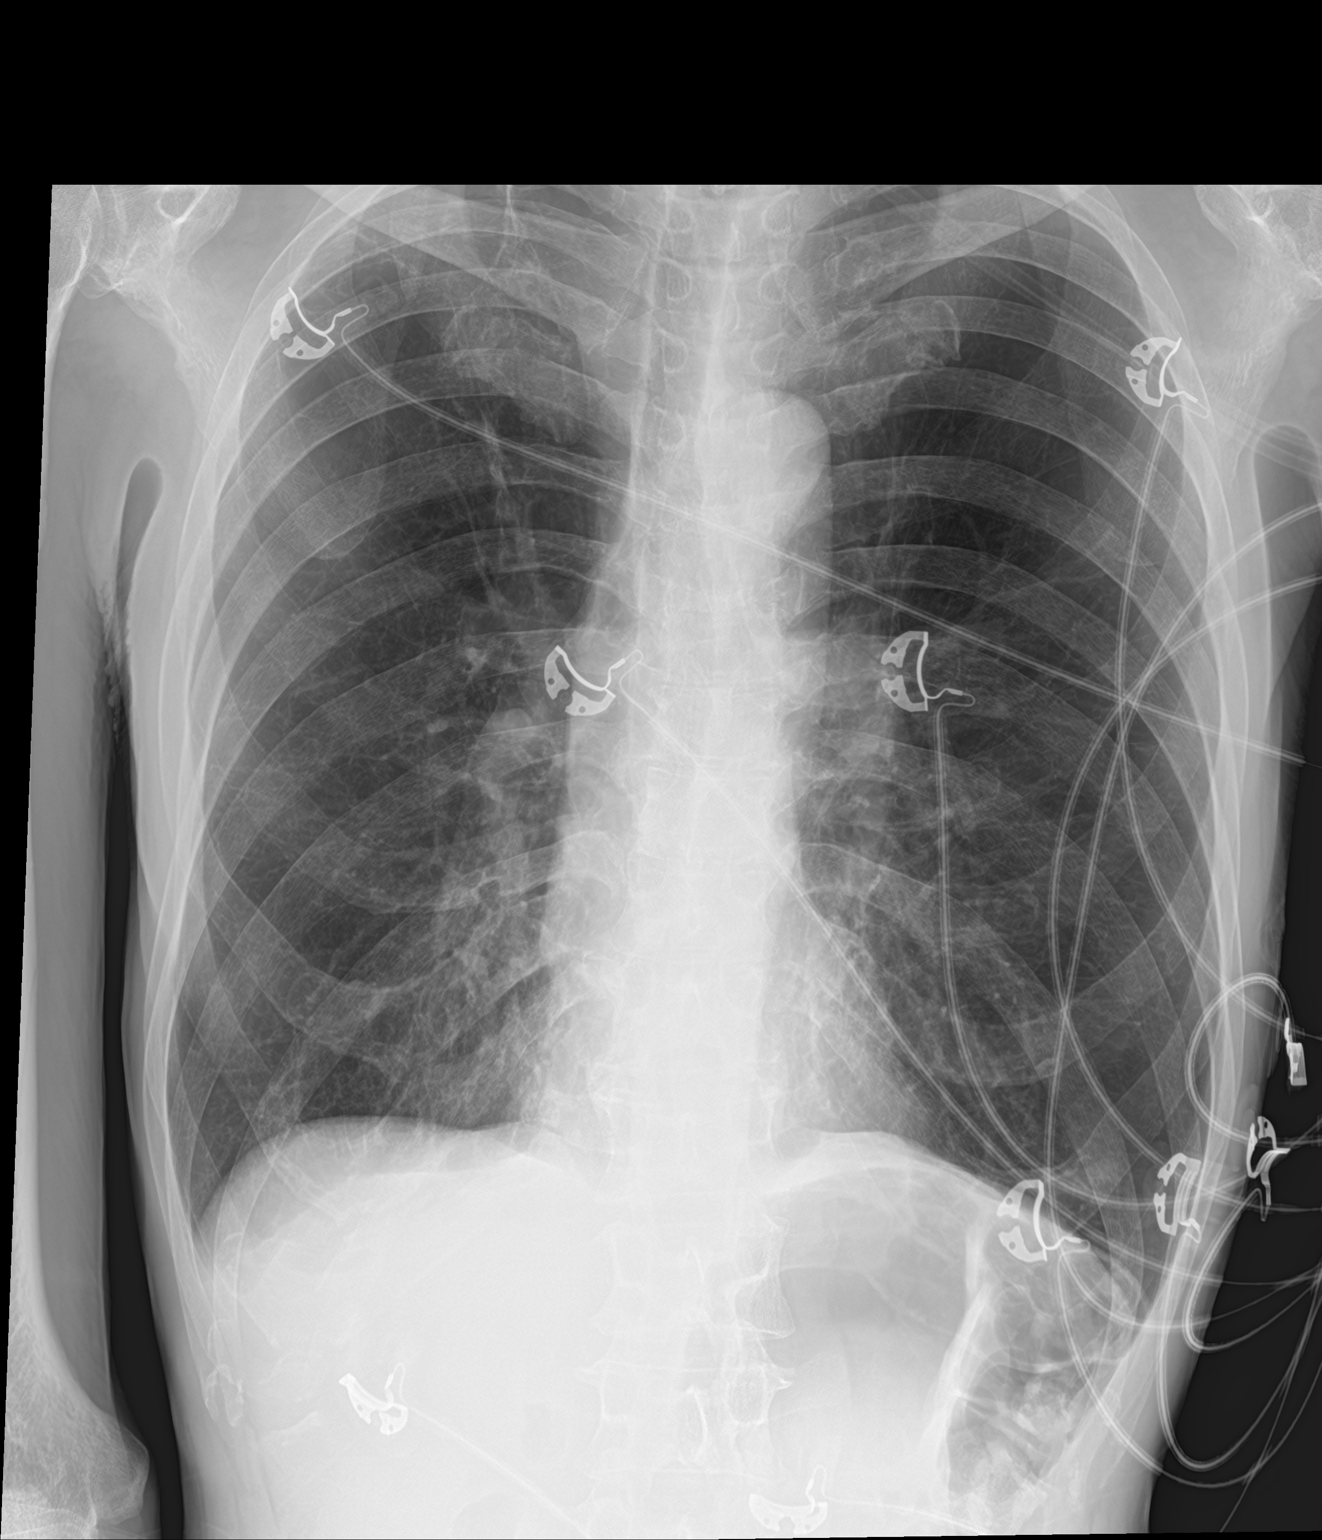

[2 of 2 positions shown; findings below may reference images not displayed]

FINDINGS: Stable scarring in the right lung apex. Emphysema with hyperinflated
lungs. No consolidation, effusion, or pneumothorax. Stable normal
cardiac silhouette given projection and technique. No acute osseous
abnormality is evident.
IMPRESSION: COPD with emphysema and right apical scarring. No acute pulmonary
process identified.

By: Chuang Etan M.D.
